# Patient Record
Sex: Female | Born: 1950 | Race: White | Hispanic: No | Marital: Single | Smoking: Never smoker
Health system: Southern US, Community
[De-identification: ages and names within clinical notes are randomized; demographics above are authoritative.]

## PROBLEM LIST (undated history)

## (undated) DIAGNOSIS — K219 Gastro-esophageal reflux disease without esophagitis: Secondary | ICD-10-CM

## (undated) DIAGNOSIS — M1990 Unspecified osteoarthritis, unspecified site: Secondary | ICD-10-CM

## (undated) DIAGNOSIS — F419 Anxiety disorder, unspecified: Secondary | ICD-10-CM

## (undated) DIAGNOSIS — F32A Depression, unspecified: Secondary | ICD-10-CM

## (undated) DIAGNOSIS — T8859XA Other complications of anesthesia, initial encounter: Secondary | ICD-10-CM

## (undated) DIAGNOSIS — F329 Major depressive disorder, single episode, unspecified: Secondary | ICD-10-CM

## (undated) DIAGNOSIS — G4730 Sleep apnea, unspecified: Secondary | ICD-10-CM

## (undated) DIAGNOSIS — D649 Anemia, unspecified: Secondary | ICD-10-CM

## (undated) DIAGNOSIS — R112 Nausea with vomiting, unspecified: Secondary | ICD-10-CM

## (undated) DIAGNOSIS — R519 Headache, unspecified: Secondary | ICD-10-CM

## (undated) HISTORY — PX: APPENDECTOMY: SHX54

## (undated) HISTORY — PX: TONSILLECTOMY: SUR1361

## (undated) HISTORY — PX: OTHER SURGICAL HISTORY: SHX169

---

## 1998-05-21 ENCOUNTER — Other Ambulatory Visit: Admission: RE | Admit: 1998-05-21 | Discharge: 1998-05-21 | Payer: Self-pay | Admitting: Obstetrics & Gynecology

## 1999-02-19 ENCOUNTER — Ambulatory Visit: Admission: RE | Admit: 1999-02-19 | Discharge: 1999-02-19 | Payer: Self-pay | Admitting: Internal Medicine

## 1999-07-10 ENCOUNTER — Other Ambulatory Visit: Admission: RE | Admit: 1999-07-10 | Discharge: 1999-07-10 | Payer: Self-pay | Admitting: Obstetrics & Gynecology

## 2000-07-28 ENCOUNTER — Other Ambulatory Visit: Admission: RE | Admit: 2000-07-28 | Discharge: 2000-07-28 | Payer: Self-pay | Admitting: Obstetrics & Gynecology

## 2001-08-05 ENCOUNTER — Other Ambulatory Visit: Admission: RE | Admit: 2001-08-05 | Discharge: 2001-08-05 | Payer: Self-pay | Admitting: Obstetrics & Gynecology

## 2002-10-27 ENCOUNTER — Other Ambulatory Visit: Admission: RE | Admit: 2002-10-27 | Discharge: 2002-10-27 | Payer: Self-pay | Admitting: Obstetrics & Gynecology

## 2003-11-22 ENCOUNTER — Other Ambulatory Visit: Admission: RE | Admit: 2003-11-22 | Discharge: 2003-11-22 | Payer: Self-pay | Admitting: Obstetrics & Gynecology

## 2005-01-14 ENCOUNTER — Other Ambulatory Visit: Admission: RE | Admit: 2005-01-14 | Discharge: 2005-01-14 | Payer: Self-pay | Admitting: Obstetrics & Gynecology

## 2005-08-11 ENCOUNTER — Ambulatory Visit: Payer: Self-pay | Admitting: Sports Medicine

## 2005-08-28 ENCOUNTER — Ambulatory Visit: Payer: Self-pay | Admitting: Family Medicine

## 2005-12-08 ENCOUNTER — Ambulatory Visit: Payer: Self-pay | Admitting: Family Medicine

## 2005-12-22 ENCOUNTER — Ambulatory Visit: Payer: Self-pay | Admitting: Family Medicine

## 2006-06-08 ENCOUNTER — Emergency Department (HOSPITAL_COMMUNITY): Admission: EM | Admit: 2006-06-08 | Discharge: 2006-06-09 | Payer: Self-pay | Admitting: Emergency Medicine

## 2014-07-09 ENCOUNTER — Other Ambulatory Visit: Payer: Self-pay | Admitting: Obstetrics & Gynecology

## 2014-07-09 DIAGNOSIS — Z780 Asymptomatic menopausal state: Secondary | ICD-10-CM

## 2014-07-16 ENCOUNTER — Ambulatory Visit
Admission: RE | Admit: 2014-07-16 | Discharge: 2014-07-16 | Disposition: A | Discharge status: Home / Self Care | Payer: BC Managed Care – PPO | Source: Ambulatory Visit | Attending: Obstetrics & Gynecology | Admitting: Obstetrics & Gynecology

## 2014-07-16 DIAGNOSIS — Z780 Asymptomatic menopausal state: Secondary | ICD-10-CM

## 2014-07-24 ENCOUNTER — Other Ambulatory Visit: Payer: Self-pay | Admitting: Obstetrics & Gynecology

## 2014-07-25 LAB — CYTOLOGY - PAP

## 2014-07-27 ENCOUNTER — Encounter: Payer: Self-pay | Admitting: Internal Medicine

## 2018-10-26 ENCOUNTER — Other Ambulatory Visit: Payer: Self-pay

## 2018-10-26 ENCOUNTER — Emergency Department (HOSPITAL_COMMUNITY): Payer: Medicare Other

## 2018-10-26 ENCOUNTER — Emergency Department (HOSPITAL_COMMUNITY)
Admission: EM | Admit: 2018-10-26 | Discharge: 2018-10-26 | Disposition: A | Discharge status: Home / Self Care | Payer: Medicare Other | Attending: Emergency Medicine | Admitting: Emergency Medicine

## 2018-10-26 ENCOUNTER — Encounter (HOSPITAL_COMMUNITY): Payer: Self-pay | Admitting: Emergency Medicine

## 2018-10-26 DIAGNOSIS — S0990XA Unspecified injury of head, initial encounter: Secondary | ICD-10-CM | POA: Diagnosis present

## 2018-10-26 DIAGNOSIS — W010XXA Fall on same level from slipping, tripping and stumbling without subsequent striking against object, initial encounter: Secondary | ICD-10-CM | POA: Insufficient documentation

## 2018-10-26 DIAGNOSIS — Y9301 Activity, walking, marching and hiking: Secondary | ICD-10-CM | POA: Insufficient documentation

## 2018-10-26 DIAGNOSIS — Y92481 Parking lot as the place of occurrence of the external cause: Secondary | ICD-10-CM | POA: Insufficient documentation

## 2018-10-26 DIAGNOSIS — Y999 Unspecified external cause status: Secondary | ICD-10-CM | POA: Insufficient documentation

## 2018-10-26 DIAGNOSIS — S0081XA Abrasion of other part of head, initial encounter: Secondary | ICD-10-CM | POA: Insufficient documentation

## 2018-10-26 DIAGNOSIS — W19XXXA Unspecified fall, initial encounter: Secondary | ICD-10-CM

## 2018-10-26 HISTORY — DX: Major depressive disorder, single episode, unspecified: F32.9

## 2018-10-26 HISTORY — DX: Depression, unspecified: F32.A

## 2018-10-26 MED ORDER — METHOCARBAMOL 500 MG PO TABS
500.0000 mg | ORAL_TABLET | Freq: Once | ORAL | Status: DC
  Filled 2018-10-26: qty 1

## 2018-10-26 MED ORDER — ACETAMINOPHEN 325 MG PO TABS
650.0000 mg | ORAL_TABLET | Freq: Once | ORAL | Status: AC
  Administered 2018-10-26: 650 mg via ORAL
  Filled 2018-10-26: qty 2

## 2018-10-26 MED ORDER — METHOCARBAMOL 500 MG PO TABS: 500.0000 mg | ORAL_TABLET | Freq: Two times a day (BID) | ORAL | 0 refills | Status: AC

## 2018-10-26 NOTE — Discharge Instructions (Signed)
I have prescribed muscle relaxers for your pain, please do not drink or drive while taking this medications as they can make you drowsy. Please follow-up with PCP as needed.
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
## 2018-10-26 NOTE — ED Notes (Signed)
Pt refusing to have wound cleaned at this time.

## 2018-10-26 NOTE — ED Provider Notes (Signed)
  Face-to-face evaluation   History: Here for mechanical fall and tripped.  Injuries only to face.  Denies neck or back pain.  Physical exam:, Calm, cooperative.  She is lucid.  Face with mild left periorbital tenderness, swelling and ecchymosis.  Minor facial abrasions.  She exhibits normal range of motion of her neck.  Medical screening examination/treatment/procedure(s) were conducted as a shared visit with non-physician practitioner(s) and myself.  I personally evaluated the patient during the encounter    Mancel BaleWentz, Keagan Anthis, MD 10/26/18 2111

## 2018-10-26 NOTE — ED Triage Notes (Signed)
Per EMS, patient from the store, c/o right leg pain after trip and fall outside. Lac above left eye and hematoma to left cheek. Denies taking blood thinners, neck and back pain and LOC.

## 2019-05-15 ENCOUNTER — Encounter: Payer: Self-pay | Admitting: Family Medicine

## 2019-05-15 ENCOUNTER — Other Ambulatory Visit: Payer: Self-pay

## 2019-05-15 ENCOUNTER — Ambulatory Visit (INDEPENDENT_AMBULATORY_CARE_PROVIDER_SITE_OTHER): Payer: Medicare Other | Admitting: Family Medicine

## 2019-05-15 DIAGNOSIS — M542 Cervicalgia: Secondary | ICD-10-CM

## 2019-05-15 MED ORDER — TIZANIDINE HCL 2 MG PO TABS: 2.0000 mg | ORAL_TABLET | Freq: Four times a day (QID) | ORAL | 1 refills | Status: DC | PRN

## 2019-05-15 MED ORDER — NABUMETONE 750 MG PO TABS: 750.0000 mg | ORAL_TABLET | Freq: Two times a day (BID) | ORAL | 6 refills | Status: DC | PRN

## 2019-05-15 NOTE — Progress Notes (Signed)
Office Visit Note   Patient: Cheryl Schmitt           Date of Birth: October 30, 1951           MRN: [id number] Visit Date: 05/15/2019 Requested by: No referring provider defined for this encounter. PCP: Cheryl Schmitt  Subjective: Chief Complaint  Patient presents with  . Neck - Pain    Fell on 04/17/2019, halfway down the steps of her house - hit her head. Pain in the neck and right scapula, with tingling down the right arm.    HPI: She is here with neck and right arm pain.  On June 1 she fell down steps of her house, she fell forward and hit her head.  Did not lose consciousness but had pain and bruising on her head and her left shoulder.  Her left shoulder never hurt and she still has a residual bruising there but ever since the fall she has had stiffness and pain in her neck, severe pain in her shoulder blade on the right and tingling down her right arm.  She has not noticed any weakness in her arm.  She went to [facility] and had x-rays taken on June 6.  I reviewed these as well and I do not see a fracture but she has diffuse spondylosis changes in her spine.  She states that she has never had much trouble with her back before.  Her shoulder blade pain keeps her from sleeping.  She has tried Advil and Aleve and Tylenol with minimal improvement.  She was given a muscle relaxant at [facility] and that has not helped much either.               ROS: Denies fevers or chills.  All other systems were reviewed and are negative.  Objective: Vital Signs: There were no vitals taken for this visit.  Physical Exam:  General:  Alert and oriented, in no acute distress. Pulm:  Breathing unlabored. Psy:  Normal mood, congruent affect. Skin: No rash on her skin.  She has resolving ecchymosis left deltoid area. Neck: Tightness and tenderness of the paraspinous muscles diffusely.  Spurling's test is equivocal, slightly decreased range of motion with neck extension and rotation bilaterally.  Very  tender rhomboid area trigger point on the right seems to reproduce much of her pain.  Upper extremity strength and reflexes are still normal.  Imaging: None today.  Assessment & Plan: 1.  Persistent neck and right arm pain 1 month status post fall, concerning for cervical disc protrusion with nerve impingement.  Neurologic exam is nonfocal. -Trial of physical therapy for myofascial release techniques and cervical traction.  We will go ahead and order an MRI scan and if she fails to improve with therapy, then possibly cervical epidural injection depending on MRI results. -Trial of Relafen and Zanaflex as needed. - If patient is not quickly improving with physical therapy, she will contact me for chiropractic referral.     Procedures: No procedures performed  No notes on file     PMFS History: There are no active problems to display for this patient.  Past Medical History:  Diagnosis Date  . Depression     History reviewed. No pertinent family history.  History reviewed. No pertinent surgical history. Social History   Occupational History  . Not on file  Tobacco Use  . Smoking status: Not on file  Substance and Sexual Activity  . Alcohol use: Not on file  . Drug  use: Not on file  . Sexual activity: Not on file

## 2019-05-16 ENCOUNTER — Other Ambulatory Visit: Payer: Self-pay

## 2019-05-16 ENCOUNTER — Ambulatory Visit: Payer: Medicare Other | Attending: Family Medicine | Admitting: Physical Therapy

## 2019-05-16 ENCOUNTER — Encounter: Payer: Self-pay | Admitting: Physical Therapy

## 2019-05-16 ENCOUNTER — Telehealth: Payer: Self-pay | Admitting: Family Medicine

## 2019-05-16 DIAGNOSIS — M6281 Muscle weakness (generalized): Secondary | ICD-10-CM | POA: Diagnosis present

## 2019-05-16 DIAGNOSIS — M542 Cervicalgia: Secondary | ICD-10-CM

## 2019-05-16 DIAGNOSIS — R293 Abnormal posture: Secondary | ICD-10-CM | POA: Diagnosis present

## 2019-05-16 NOTE — Telephone Encounter (Signed)
Patient called stated she lost the slip that had 2 medications that should be filled and the name of a PT the patient is to be going to.  [phone]

## 2019-05-16 NOTE — Patient Instructions (Signed)

## 2019-05-16 NOTE — Therapy (Signed)
[facility] [address], [place], [place], [address] Phone: [phone]   Fax:  [phone]  Physical Therapy Evaluation  Patient Details  Name: Cheryl Schmitt MRN: [id number] Date of Birth: 12/29/1950 Referring Provider (PT): Eunice Blase, MD   Encounter Date: 05/16/2019  PT End of Session - 05/16/19 1902    Visit Number  1    Date for PT Re-Evaluation  07/11/19    PT Start Time  1902    PT Stop Time  1940    PT Time Calculation (min)  38 min    Activity Tolerance  Patient tolerated treatment well    Behavior During Therapy  [facility] for tasks assessed/performed       Past Medical History:  Diagnosis Date  . Depression     History reviewed. No pertinent surgical history.  There were no vitals filed for this visit.   Subjective Assessment - 05/16/19 1905    Subjective  Pt fell and hit her head and has been having neck pain and around the shoulder.  She reports she cannot sleep very long.  I have had to sit up and sleep in the recliner.    Currently in Pain?  Yes    Pain Score  2     Pain Location  Neck    Pain Orientation  Right    Pain Descriptors / Indicators  Tingling;Sharp;Aching    Pain Type  Acute pain    Pain Radiating Towards  tingles down the arm to finger tips, pain around Rt scapula    Pain Onset  More than a month ago    Pain Frequency  Intermittent    Aggravating Factors   lie back on it wrong, doing thing with the Rt arm    Pain Relieving Factors  Tylenol, heat, sitting up straight    Effect of Pain on Daily Activities  sleep and doing things with the Rt arm    Multiple Pain Sites  No         [facility] PT Assessment - 05/16/19 0001      Assessment   Medical Diagnosis  M54.2 (ICD-10-CM) - Cervicalgia    Referring Provider (PT)  Hilts, Michael, MD    Onset Date/Surgical Date  04/17/19    Hand Dominance  Right    Prior Therapy  No      Precautions   Precautions  None      Restrictions    Weight Bearing Restrictions  No      Balance Screen   Has the patient fallen in the past 6 months  Yes    How many times?  2 x tripped in a parking lot and down the stairs    Has the patient had a decrease in activity level because of a fear of falling?   No    Is the patient reluctant to leave their home because of a fear of falling?   No      Home Film/video editor residence    Living Arrangements  Alone      Prior Function   Level of Independence  Independent    Vocation  Other (comment)   Not currently   Vocation Requirements  working at [employer], boxes, books - librarian      Cognition   Overall Cognitive Status  Within Functional Limits for tasks assessed      Observation/Other Assessments   Focus on Therapeutic Outcomes (FOTO)  51%      Posture/Postural Control   Posture/Postural Control  Postural limitations    Postural Limitations  Rounded Shoulders;Forward head    Posture Comments  leaning left in sitting, Rt shoulder elevated      ROM / Strength   AROM / PROM / Strength  AROM;Strength      AROM   AROM Assessment Site  Cervical    Cervical Flexion  25    Cervical Extension  37    Cervical - Right Side Bend  11    Cervical - Left Side Bend  18    Cervical - Right Rotation  35    Cervical - Left Rotation  40   weaker     Strength   Overall Strength Comments  elbow extension Rt 4-/5    Strength Assessment Site  Shoulder    Right/Left Shoulder  Right;Left    Right Shoulder Flexion  4-/5    Right Shoulder ABduction  4/5    Left Shoulder ABduction  4-/5      Palpation   Palpation comment  TTP and tight suboccipitals, cervical paraspinals, upper traps - Rt >Lt      Special Tests    Special Tests  Cervical    Cervical Tests  Spurling's;Dictraction      Spurling's   Findings  Negative      Distraction Test   Findngs  Positive    Comment  felt relief when did in seated position, increased pain around shoulder in supine                 Objective measurements completed on examination: See above findings.      [facility] Adult PT Treatment/Exercise - 05/16/19 0001      Self-Care   Self-Care  Other Self-Care Comments    Other Self-Care Comments   initial HEP educated and performed             PT Education - 05/16/19 1953    Education Details  Access Code: [email or web address] and dry needling info    Person(s) Educated  Patient    Methods  Explanation;Demonstration;Handout;Verbal cues    Comprehension  Returned demonstration;Verbalized understanding       PT Short Term Goals - 05/16/19 1957      PT SHORT TERM GOAL #1   Title  ind with initial HEP    Time  4    Period  Weeks    Status  New    Target Date  06/13/19      PT SHORT TERM GOAL #2   Title  cervical ROM improved to at least 45 deg bilat    Time  4    Period  Weeks    Status  New    Target Date  06/13/19      PT SHORT TERM GOAL #3   Title  Pt will have no radicular symptoms down her arm due to reduced muscle spasms    Time  4    Period  Weeks    Status  New    Target Date  07/11/19        PT Long Term Goals - 05/16/19 1953      PT LONG TERM GOAL #1   Title  Pt will be able to take a bath due to increased UE strength    Baseline  cannot push up out of the tub    Time  8    Period  Weeks    Status  New    Target Date  07/11/19      PT LONG TERM GOAL #2   Title  Pt will demonstrate at least 50 deg of cervical rotation bilat for improved ability to look behind when driving    Time  8    Period  Weeks    Status  New    Target Date  07/11/19      PT LONG TERM GOAL #3   Title  Pt will report at least 60% less pain when cleaning her home    Time  8    Period  Weeks    Status  New    Target Date  07/11/19      PT LONG TERM GOAL #4   Title  Pt will report < or = 40% limitation on FOTO    Time  8    Period  Weeks    Status  New    Target Date  07/11/19             Plan - 05/16/19 1958    Clinical Impression  Statement  Pt presents to skilled PT due to falling one month ago and hit her head. She is having pain and radicular symptoms around the Rt shoulder blade with tingling down the Rt arm. Pt has decreased UE strength and decreased cervical ROM and strength.  Pt's Rt shoulder is elevated with increased tone in Rt upper trap.  She has some wingin of Rt scapula with shoulder flexion.  Pt has increased thoracic kyphosis.  She has muscle spasms throughout her suboccipitals, cervical paraspinals and upper traps all worse on the Rt side.  Pt will benefit from skilled PT to address impairments and facilitate healing of soft tissues so she can return to maximum function.    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    Examination-Activity Limitations  Sleep;Bathing    Examination-Participation Restrictions  Driving    Stability/Clinical Decision Making  Evolving/Moderate complexity    Clinical Decision Making  Moderate    Rehab Potential  Good    PT Frequency  2x / week    PT Duration  8 weeks    PT Treatment/Interventions  ADLs/Self Care Home Management;Cryotherapy;Electrical Stimulation;Moist Heat;Traction;Therapeutic activities;Therapeutic exercise;Neuromuscular re-education;Manual techniques;Dry needling;Passive range of motion;Patient/family education;Taping    PT Next Visit Plan  dry needling to suboccipitals, oblique capitus, cervical parapsinals, traction, cervical ROM, posture    Consulted and Agree with Plan of Care  Patient       Patient will benefit from skilled therapeutic intervention in order to improve the following deficits and impairments:  Pain, Increased fascial restricitons, Increased muscle spasms, Impaired tone, Postural dysfunction, Decreased strength, Decreased range of motion  Visit Diagnosis: 1. Cervicalgia   2. Abnormal posture   3. Muscle weakness (generalized)        Problem List There are no active problems to  display for this patient.   Junious SilkJakki L Hisayo Delossantos, PT 05/16/2019, 8:11 PM  [facility] [address], [address] [place], [place], [address] Phone: [phone]   Fax:  [phone]  Name: Farrel GobbleMartha Panas MRN: [id number] Date of Birth: [birth year]

## 2019-05-17 NOTE — Telephone Encounter (Signed)
I called the patient yesterday afternoon. She was driving, so she could not write down the information I gave her. She requested a new AVS to be printed for her to pick up (this info is included on it). The front desk to care of it from there.

## 2019-05-18 ENCOUNTER — Ambulatory Visit: Payer: Medicare Other | Attending: Family Medicine | Admitting: Physical Therapy

## 2019-05-18 ENCOUNTER — Encounter: Payer: Self-pay | Admitting: Physical Therapy

## 2019-05-18 ENCOUNTER — Other Ambulatory Visit: Payer: Self-pay

## 2019-05-18 DIAGNOSIS — R293 Abnormal posture: Secondary | ICD-10-CM | POA: Diagnosis present

## 2019-05-18 DIAGNOSIS — M542 Cervicalgia: Secondary | ICD-10-CM

## 2019-05-18 DIAGNOSIS — M6281 Muscle weakness (generalized): Secondary | ICD-10-CM

## 2019-05-18 NOTE — Therapy (Signed)
[facility] [address], [place], [place], [address] Phone: [phone]   Fax:  [phone]  Physical Therapy Treatment  Patient Details  Name: Cheryl Schmitt MRN: [id number] Date of Birth: 1951/07/20 Referring Provider (PT): Eunice Blase, MD   Encounter Date: 05/18/2019  PT End of Session - 05/18/19 1537    Visit Number  2    Date for PT Re-Evaluation  07/11/19    PT Start Time  1505    PT Stop Time  1550    PT Time Calculation (min)  45 min    Activity Tolerance  Patient tolerated treatment well    Behavior During Therapy  [facility] for tasks assessed/performed       Past Medical History:  Diagnosis Date  . Depression     History reviewed. No pertinent surgical history.  There were no vitals filed for this visit.  Subjective Assessment - 05/18/19 1544    Subjective  I am a little more stiff than yesterday    Currently in Pain?  Yes    Pain Score  4     Pain Location  Neck    Pain Orientation  Right    Pain Descriptors / Indicators  Sharp    Pain Type  Acute pain    Pain Onset  More than a month ago    Pain Frequency  Intermittent    Multiple Pain Sites  No                       [facility] Adult PT Treatment/Exercise - 05/18/19 0001      Modalities   Modalities  Moist Heat;Electrical Stimulation      Moist Heat Therapy   Number Minutes Moist Heat  15 Minutes    Moist Heat Location  Cervical      Electrical Stimulation   Electrical Stimulation Location  Rt scapula    Electrical Stimulation Action  IFC    Electrical Stimulation Parameters  to tolerance - 62m    Electrical Stimulation Goals  Pain      Manual Therapy   Manual Therapy  Soft tissue mobilization    Soft tissue mobilization  periscapular muslce, teres minor and infraspinatus, upper trap, cervical paraspinlas, suboccipitals       Trigger Point Dry Needling - 05/18/19 0001    Consent Given?  Yes    Education Handout  Provided  Previously provided    Muscles Treated Head and Neck  Suboccipitals;Upper trapezius    Upper Trapezius Response  Twitch reponse elicited;Palpable increased muscle length             PT Short Term Goals - 05/16/19 1957      PT SHORT TERM GOAL #1   Title  ind with initial HEP    Time  4    Period  Weeks    Status  New    Target Date  06/13/19      PT SHORT TERM GOAL #2   Title  cervical ROM improved to at least 45 deg bilat    Time  4    Period  Weeks    Status  New    Target Date  06/13/19      PT SHORT TERM GOAL #3   Title  Pt will have no radicular symptoms down her arm due to reduced muscle spasms    Time  4    Period  Weeks    Status  New  Target Date  07/11/19        PT Long Term Goals - 05/16/19 1953      PT LONG TERM GOAL #1   Title  Pt will be able to take a bath due to increased UE strength    Baseline  cannot push up out of the tub    Time  8    Period  Weeks    Status  New    Target Date  07/11/19      PT LONG TERM GOAL #2   Title  Pt will demonstrate at least 50 deg of cervical rotation bilat for improved ability to look behind when driving    Time  8    Period  Weeks    Status  New    Target Date  07/11/19      PT LONG TERM GOAL #3   Title  Pt will report at least 60% less pain when cleaning her home    Time  8    Period  Weeks    Status  New    Target Date  07/11/19      PT LONG TERM GOAL #4   Title  Pt will report < or = 40% limitation on FOTO    Time  8    Period  Weeks    Status  New    Target Date  07/11/19            Plan - 05/18/19 1539    Clinical Impression Statement  No goals met yet today, first treatment.  Pt did well with dry needling although she was nervous about it initially.  Pt had a lot of muscle twitches on Rt upper trap.  She had good release with manual techniques.  pt will continue to benefit from skilled PT to help reduce muscle spasms    PT Treatment/Interventions  ADLs/Self Care Home  Management;Cryotherapy;Electrical Stimulation;Moist Heat;Traction;Therapeutic activities;Therapeutic exercise;Neuromuscular re-education;Manual techniques;Dry needling;Passive range of motion;Patient/family education;Taping    PT Next Visit Plan  f/u on dry needling to suboccipitals, upper trap, cervical ROM, posture    PT Home Exercise Plan  Access Code: [email or web address]    Consulted and Agree with Plan of Care  Patient       Patient will benefit from skilled therapeutic intervention in order to improve the following deficits and impairments:  Pain, Increased fascial restricitons, Increased muscle spasms, Impaired tone, Postural dysfunction, Decreased strength, Decreased range of motion  Visit Diagnosis: 1. Cervicalgia   2. Abnormal posture   3. Muscle weakness (generalized)        Problem List There are no active problems to display for this patient.   Jule Ser, PT 05/18/2019, 3:48 PM  [facility] [address] [place], [place], [address] Phone: [phone]   Fax:  [phone]  Name: Cheryl Schmitt MRN: [id number] Date of Birth: Dec 16, 1950

## 2019-05-22 ENCOUNTER — Encounter: Payer: Self-pay | Admitting: Physical Therapy

## 2019-05-22 ENCOUNTER — Other Ambulatory Visit: Payer: Self-pay

## 2019-05-22 ENCOUNTER — Ambulatory Visit: Payer: Medicare Other | Admitting: Physical Therapy

## 2019-05-22 DIAGNOSIS — R293 Abnormal posture: Secondary | ICD-10-CM

## 2019-05-22 DIAGNOSIS — M542 Cervicalgia: Secondary | ICD-10-CM

## 2019-05-22 DIAGNOSIS — M6281 Muscle weakness (generalized): Secondary | ICD-10-CM

## 2019-05-22 NOTE — Therapy (Signed)
[facility] [address], [address] [place], [place], [address] Phone: [phone]   Fax:  [phone]  Physical Therapy Treatment  Patient Details  Name: Cheryl GobbleMartha Peeler MRN: [id number] Date of Birth: 1951/04/11 Referring Provider (PT): Lavada MesiHilts, Michael, MD   Encounter Date: 05/22/2019  PT End of Session - 05/22/19 1312    Visit Number  3    Date for PT Re-Evaluation  07/11/19    PT Start Time  1306   came late   PT Stop Time  1350    PT Time Calculation (min)  44 min    Activity Tolerance  Patient tolerated treatment well    Behavior During Therapy  [facility] for tasks assessed/performed       Past Medical History:  Diagnosis Date  . Depression     History reviewed. No pertinent surgical history.  There were no vitals filed for this visit.  Subjective Assessment - 05/22/19 1310    Subjective  My neck is stiff but my shoulder blade on the right.    Patient Stated Goals  Take away the pain    Currently in Pain?  Yes    Pain Score  5     Pain Location  Scapula    Pain Orientation  Right    Pain Descriptors / Indicators  Throbbing    Pain Type  Acute pain    Pain Onset  More than a month ago    Pain Frequency  Intermittent    Aggravating Factors   lie on back on it wrond, doing thing with the right arm    Pain Relieving Factors  Tylenol, heat, sitting up straight.                       [facility] Adult PT Treatment/Exercise - 05/22/19 0001      Neck Exercises: Seated   Cervical Isometrics  Extension;3 secs;5 reps   to improve retraction   Neck Retraction  5 reps   no change in pain   Lateral Flexion  Right;5 reps      Neck Exercises: Supine   Cervical Isometrics  Extension;3 secs;5 reps   to improve retraction   Cervical Rotation  Right;Left;5 reps   A/AROM   Cervical Rotation Limitations  both ways    Lateral Flexion  Right;Left;5 reps   supine; A/AROM     Manual Therapy   Manual Therapy  Joint  mobilization;Soft tissue mobilization;Manual Traction    Joint Mobilization  T1-T5 P-A mobilization grade 3 in sitting; supine sideglide to bil. C2-C7, rotational mobilization to C1    Soft tissue mobilization  cervical paraspinals, subocciptials, SCM, along T1-T4, upper trap and scalenes    Manual Traction  1 min       Trigger Point Dry Needling - 05/22/19 0001    Consent Given?  Yes    Muscles Treated Head and Neck  Cervical multifidi    Other Dry Needling  T1-T4 multfidi    Cervical multifidi Response  Twitch reponse elicited;Palpable increased muscle length             PT Short Term Goals - 05/22/19 1351      PT SHORT TERM GOAL #1   Title  ind with initial HEP    Baseline  still learning    Time  4    Period  Weeks    Status  On-going    Target Date  06/13/19      PT  SHORT TERM GOAL #2   Title  cervical ROM improved to at least 45 deg bilat    Baseline  more after joint mobilization    Time  4    Period  Weeks    Status  On-going      PT SHORT TERM GOAL #3   Title  Pt will have no radicular symptoms down her arm due to reduced muscle spasms    Time  4    Period  Weeks    Status  On-going    Target Date  07/11/19        PT Long Term Goals - 05/16/19 1953      PT LONG TERM GOAL #1   Title  Pt will be able to take a bath due to increased UE strength    Baseline  cannot push up out of the tub    Time  8    Period  Weeks    Status  New    Target Date  07/11/19      PT LONG TERM GOAL #2   Title  Pt will demonstrate at least 50 deg of cervical rotation bilat for improved ability to look behind when driving    Time  8    Period  Weeks    Status  New    Target Date  07/11/19      PT LONG TERM GOAL #3   Title  Pt will report at least 60% less pain when cleaning her home    Time  8    Period  Weeks    Status  New    Target Date  07/11/19      PT LONG TERM GOAL #4   Title  Pt will report < or = 40% limitation on FOTO    Time  8    Period  Weeks     Status  New    Target Date  07/11/19            Plan - 05/22/19 1348    Clinical Impression Statement  Patient had no  right scapular pain after treatment just tightness. Patient has decreased mobility of C1 to restrict rotation. Patient T1-T4 is in flexion. Patient has more tightness in the left cervical paraspinals. Patient is to continue to benefit from skilled PT to reduce muscle spasms and pain to improve funtion.    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    Examination-Activity Limitations  Sleep;Bathing    Examination-Participation Restrictions  Driving    Stability/Clinical Decision Making  Evolving/Moderate complexity    PT Frequency  2x / week    PT Duration  8 weeks    PT Treatment/Interventions  ADLs/Self Care Home Management;Cryotherapy;Electrical Stimulation;Moist Heat;Traction;Therapeutic activities;Therapeutic exercise;Neuromuscular re-education;Manual techniques;Dry needling;Passive range of motion;Patient/family education;Taping    PT Next Visit Plan  cervical ROM, posture, joint mobilization to cervical and upper thoracic, supine chin retraction, supine chin retraction with cervical sidebending    PT Home Exercise Plan  Access Code: [email or web address]    Recommended Other Services  MD signed initial note    Consulted and Agree with Plan of Care  Patient       Patient will benefit from skilled therapeutic intervention in order to improve the following deficits and impairments:  Pain, Increased fascial restricitons, Increased muscle spasms, Impaired tone, Postural dysfunction, Decreased strength, Decreased range of motion  Visit Diagnosis: 1. Cervicalgia   2. Abnormal posture  3. Muscle weakness (generalized)        Problem List There are no active problems to display for this patient.   Eulis FosterCheryl Veleta Yamamoto, PT 05/22/19 1:53 PM   [facility] [address],  [address] [place], [place], [address] Phone: [phone]   Fax:  [phone]  Name: Cheryl GobbleMartha Todaro MRN: [id number] Date of Birth: [birth year]

## 2019-05-24 ENCOUNTER — Encounter: Payer: Self-pay | Admitting: Physical Therapy

## 2019-05-24 ENCOUNTER — Ambulatory Visit: Payer: Medicare Other | Admitting: Physical Therapy

## 2019-05-24 ENCOUNTER — Other Ambulatory Visit: Payer: Self-pay

## 2019-05-24 DIAGNOSIS — R293 Abnormal posture: Secondary | ICD-10-CM

## 2019-05-24 DIAGNOSIS — M542 Cervicalgia: Secondary | ICD-10-CM | POA: Diagnosis not present

## 2019-05-24 DIAGNOSIS — M6281 Muscle weakness (generalized): Secondary | ICD-10-CM

## 2019-05-24 NOTE — Therapy (Signed)
[facility] [address] [place], [place], [address] Phone: [phone]   Fax:  [phone]  Physical Therapy Treatment  Patient Details  Name: Cheryl Schmitt MRN: [id number] Date of Birth: Apr 05, 1951 Referring Provider (PT): Eunice Blase, MD   Encounter Date: 05/24/2019  PT End of Session - 05/24/19 1916    Visit Number  4    Date for PT Re-Evaluation  07/11/19    PT Start Time  1900    PT Stop Time  1940    PT Time Calculation (min)  40 min    Activity Tolerance  Patient tolerated treatment well    Behavior During Therapy  [facility] for tasks assessed/performed       Past Medical History:  Diagnosis Date  . Depression     History reviewed. No pertinent surgical history.  There were no vitals filed for this visit.  Subjective Assessment - 05/24/19 1908    Subjective  It feels better than when I came on Monday.  It is a little tight.    Currently in Pain?  Yes    Pain Score  3     Pain Location  Neck    Pain Orientation  Right    Pain Descriptors / Indicators  Throbbing    Pain Onset  More than a month ago    Multiple Pain Sites  No                       [facility] Adult PT Treatment/Exercise - 05/24/19 0001      Neuro Re-ed    Neuro Re-ed Details   cues for posture and cervical retraction throughout      Neck Exercises: Supine   Neck Retraction  10 reps;3 secs    Shoulder ABduction  Both;15 reps;Limitations    Shoulder Abduction Limitations  horizontal abduction red band    Other Supine Exercise  shoulder ERwith cervical retraction - red band - 15 x      Shoulder Exercises: Standing   Extension  Strengthening;Both;15 reps;Theraband    Theraband Level (Shoulder Extension)  Level 1 (Yellow)    Row  Strengthening;15 reps;Theraband    Theraband Level (Shoulder Row)  Level 1 (Yellow)    Other Standing Exercises  tricep extension - yellow band - 15x       Manual Therapy   Soft tissue  mobilization  cervical paraspinals, subocciptials, SCM, along T1-T4, upper trap and scalenes    Manual Traction  1 min               PT Short Term Goals - 05/24/19 1914      PT SHORT TERM GOAL #1   Title  ind with initial HEP    Status  Achieved      PT SHORT TERM GOAL #3   Title  Pt will have no radicular symptoms down her arm due to reduced muscle spasms    Baseline  feel the tingling when I grip things in my fingers    Status  On-going        PT Long Term Goals - 05/16/19 1953      PT LONG TERM GOAL #1   Title  Pt will be able to take a bath due to increased UE strength    Baseline  cannot push up out of the tub    Time  8    Period  Weeks    Status  New  Target Date  07/11/19      PT LONG TERM GOAL #2   Title  Pt will demonstrate at least 50 deg of cervical rotation bilat for improved ability to look behind when driving    Time  8    Period  Weeks    Status  New    Target Date  07/11/19      PT LONG TERM GOAL #3   Title  Pt will report at least 60% less pain when cleaning her home    Time  8    Period  Weeks    Status  New    Target Date  07/11/19      PT LONG TERM GOAL #4   Title  Pt will report < or = 40% limitation on FOTO    Time  8    Period  Weeks    Status  New    Target Date  07/11/19            Plan - 05/24/19 1947    Clinical Impression Statement  Pt had no increased pain with exercises.  She did report some tingling in right hand when doing exercises supine.  Pt is feeling less pain since start of PT . Pt needs cues for posture throughout treatment.  She will continue to benefit from skilled PT to address muscle and soft tissue adhesions for pain management.    PT Treatment/Interventions  ADLs/Self Care Home Management;Cryotherapy;Electrical Stimulation;Moist Heat;Traction;Therapeutic activities;Therapeutic exercise;Neuromuscular re-education;Manual techniques;Dry needling;Passive range of motion;Patient/family education;Taping     PT Next Visit Plan  f/u on band exercises added to HEP, cervical ROM, posture, joint mobilization to cervical and upper thoracic, supine chin retraction, supine chin retraction with cervical sidebending    PT Home Exercise Plan  Access Code: [email or web address]    Consulted and Agree with Plan of Care  Patient       Patient will benefit from skilled therapeutic intervention in order to improve the following deficits and impairments:  Pain, Increased fascial restricitons, Increased muscle spasms, Impaired tone, Postural dysfunction, Decreased strength, Decreased range of motion  Visit Diagnosis: 1. Cervicalgia   2. Abnormal posture   3. Muscle weakness (generalized)        Problem List There are no active problems to display for this patient.   Cheryl Schmitt, PT 05/24/2019, 7:50 PM  [facility] [address], [address] [place], [place], [address] Phone: [phone]   Fax:  [phone]  Name: Cheryl GobbleMartha Schmitt MRN: [id number] Date of Birth: 27-Feb-1951

## 2019-05-30 ENCOUNTER — Other Ambulatory Visit: Payer: Self-pay

## 2019-05-30 ENCOUNTER — Ambulatory Visit: Payer: Medicare Other | Admitting: Physical Therapy

## 2019-05-30 ENCOUNTER — Encounter: Payer: Self-pay | Admitting: Physical Therapy

## 2019-05-30 DIAGNOSIS — M542 Cervicalgia: Secondary | ICD-10-CM

## 2019-05-30 DIAGNOSIS — R293 Abnormal posture: Secondary | ICD-10-CM

## 2019-05-30 DIAGNOSIS — M6281 Muscle weakness (generalized): Secondary | ICD-10-CM

## 2019-05-30 NOTE — Therapy (Signed)
[facility] [address], [address] [place], [place], [address] Phone: [phone]   Fax:  [phone]  Physical Therapy Treatment  Patient Details  Name: Cheryl Schmitt MRN: [id number] Date of Birth: Apr 27, 1951 Referring Provider (PT): Lavada MesiHilts, Michael, MD   Encounter Date: 05/30/2019  PT End of Session - 05/30/19 1049    Visit Number  5    Date for PT Re-Evaluation  07/11/19    Authorization Type  [employer] medicare    PT Start Time  1005    PT Stop Time  1045    PT Time Calculation (min)  40 min    Activity Tolerance  Patient tolerated treatment well;No increased pain    Behavior During Therapy  [facility] for tasks assessed/performed       Past Medical History:  Diagnosis Date  . Depression     History reviewed. No pertinent surgical history.  There were no vitals filed for this visit.  Subjective Assessment - 05/30/19 1008    Subjective  I still feel stiff. I still have trouble sleeping and when I wake up the right shoulder hurts and neck is stiff. I have been doing my exercises.    Patient Stated Goals  Take away the pain    Currently in Pain?  Yes    Pain Score  2     Pain Location  Neck    Pain Orientation  Right    Pain Descriptors / Indicators  Throbbing    Pain Type  Acute pain    Pain Onset  More than a month ago    Pain Frequency  Intermittent    Aggravating Factors   lie on back on it wrong, doing thing with the right arm    Pain Relieving Factors  Tylenol, heat, sitting up straight    Multiple Pain Sites  No                       [facility] Adult PT Treatment/Exercise - 05/30/19 0001      Self-Care   Self-Care  Other Self-Care Comments    Other Self-Care Comments   instructed patient on different sleeping positions to decrease strain on cervical      Neck Exercises: Supine   Neck Retraction  5 reps;5 secs    Lateral Flexion  Right;5 reps   using right hand to slide over     Manual Therapy   Manual Therapy  Joint mobilization;Soft tissue mobilization    Joint Mobilization  T1-T5 P-A mobilization grade 3 in sitting; supine sideglide to bil. C2-C7, rotational mobilization to C1    Soft tissue mobilization  cervical paraspinals, subocciptials, SCM, along T1-T4, upper trap and scalenes    Manual Traction  1 min       Trigger Point Dry Needling - 05/30/19 0001    Consent Given?  Yes    Muscles Treated Head and Neck  Cervical multifidi;Levator scapulae    Muscles Treated Upper Quadrant  Rhomboids   right   Other Dry Needling  T1-T4 multfidi    Levator Scapulae Response  Twitch response elicited;Palpable increased muscle length    Cervical multifidi Response  Twitch reponse elicited;Palpable increased muscle length    Rhomboids Response  Twitch response elicited;Palpable increased muscle length             PT Short Term Goals - 05/24/19 1914      PT SHORT TERM GOAL #1   Title  ind with initial  HEP    Status  Achieved      PT SHORT TERM GOAL #3   Title  Pt will have no radicular symptoms down her arm due to reduced muscle spasms    Baseline  feel the tingling when I grip things in my fingers    Status  On-going        PT Long Term Goals - 05/16/19 1953      PT LONG TERM GOAL #1   Title  Pt will be able to take a bath due to increased UE strength    Baseline  cannot push up out of the tub    Time  8    Period  Weeks    Status  New    Target Date  07/11/19      PT LONG TERM GOAL #2   Title  Pt will demonstrate at least 50 deg of cervical rotation bilat for improved ability to look behind when driving    Time  8    Period  Weeks    Status  New    Target Date  07/11/19      PT LONG TERM GOAL #3   Title  Pt will report at least 60% less pain when cleaning her home    Time  8    Period  Weeks    Status  New    Target Date  07/11/19      PT LONG TERM GOAL #4   Title  Pt will report < or = 40% limitation on FOTO    Time  8    Period  Weeks    Status   New    Target Date  07/11/19            Plan - 05/30/19 1050    Clinical Impression Statement  Patient right scapula pain would be reduced with cervical retraction then right sidebending in supine. Patient has learned ways to sleep with decreased strain on cervical. After treatment patient had no pain. Patient has tightness in C1-C3 and C7-T2. Patient will continue to benefit from skilled PT to address muscle and soft tissue adhesions for pain management.    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    Examination-Activity Limitations  Sleep;Bathing    Examination-Participation Restrictions  Driving    Stability/Clinical Decision Making  Evolving/Moderate complexity    Rehab Potential  Good    PT Frequency  2x / week    PT Duration  8 weeks    PT Treatment/Interventions  ADLs/Self Care Home Management;Cryotherapy;Electrical Stimulation;Moist Heat;Traction;Therapeutic activities;Therapeutic exercise;Neuromuscular re-education;Manual techniques;Dry needling;Passive range of motion;Patient/family education;Taping    PT Next Visit Plan  update HEP including chin retraction in supine 5 times followed by right cervical sidebending in supine; soft tissue work, joint mobilization, see if sleeping is better; update goals    PT Home Exercise Plan  Access Code: [email or web address]    Consulted and Agree with Plan of Care  Patient       Patient will benefit from skilled therapeutic intervention in order to improve the following deficits and impairments:  Pain, Increased fascial restricitons, Increased muscle spasms, Impaired tone, Postural dysfunction, Decreased strength, Decreased range of motion  Visit Diagnosis: 1. Cervicalgia   2. Abnormal posture   3. Muscle weakness (generalized)        Problem List There are no active problems to display for this patient.   Earlie Counts, PT 05/30/19 10:55  AM   [facility] [address], [address] [place], [place], [address] Phone: [phone]   Fax:  [phone]  Name: Cheryl Schmitt MRN: [id number] Date of Birth: [birth year]

## 2019-05-31 ENCOUNTER — Other Ambulatory Visit: Payer: Self-pay

## 2019-05-31 ENCOUNTER — Ambulatory Visit: Payer: Medicare Other | Admitting: Physical Therapy

## 2019-05-31 ENCOUNTER — Encounter: Payer: Self-pay | Admitting: Physical Therapy

## 2019-05-31 DIAGNOSIS — R293 Abnormal posture: Secondary | ICD-10-CM

## 2019-05-31 DIAGNOSIS — M542 Cervicalgia: Secondary | ICD-10-CM

## 2019-05-31 DIAGNOSIS — M6281 Muscle weakness (generalized): Secondary | ICD-10-CM

## 2019-05-31 NOTE — Therapy (Signed)
[facility] [address], [place], [place], [address] Phone: [phone]   Fax:  [phone]  Physical Therapy Treatment  Patient Details  Name: Cheryl Schmitt MRN: [id number] Date of Birth: 09-Jun-1951 Referring Provider (PT): Eunice Blase, MD   Encounter Date: 05/31/2019  PT End of Session - 05/31/19 1540    Visit Number  6    Date for PT Re-Evaluation  07/11/19    Authorization Type  [employer] medicare    PT Start Time  3329    PT Stop Time  1620    PT Time Calculation (min)  40 min    Activity Tolerance  Patient tolerated treatment well;No increased pain    Behavior During Therapy  WFL for tasks assessed/performed       Past Medical History:  Diagnosis Date  . Depression     History reviewed. No pertinent surgical history.  There were no vitals filed for this visit.  Subjective Assessment - 05/31/19 1547    Subjective  I feel better after yesterday.  Much less pain.  I carry more on my left side because I hurt my Rt bicep a while back.    Patient Stated Goals  Take away the pain    Currently in Pain?  Yes    Pain Score  2     Pain Location  Neck    Pain Orientation  Right    Pain Descriptors / Indicators  Sore    Pain Type  Acute pain    Pain Onset  More than a month ago    Multiple Pain Sites  No                       [facility] Adult PT Treatment/Exercise - 05/31/19 0001      Neck Exercises: Supine   Neck Retraction  5 reps;5 secs    Capital Flexion  10 reps;3 secs    Lateral Flexion  Right;5 reps   using right hand to slide over     Manual Therapy   Manual Therapy  Myofascial release    Soft tissue mobilization  periscapular muslce, teres minor and infraspinatus, upper trap, cervical paraspinlas, suboccipitals    Myofascial Release  suboccipital release               PT Short Term Goals - 05/24/19 1914      PT SHORT TERM GOAL #1   Title  ind with initial HEP    Status   Achieved      PT SHORT TERM GOAL #3   Title  Pt will have no radicular symptoms down her arm due to reduced muscle spasms    Baseline  feel the tingling when I grip things in my fingers    Status  On-going        PT Long Term Goals - 05/16/19 1953      PT LONG TERM GOAL #1   Title  Pt will be able to take a bath due to increased UE strength    Baseline  cannot push up out of the tub    Time  8    Period  Weeks    Status  New    Target Date  07/11/19      PT LONG TERM GOAL #2   Title  Pt will demonstrate at least 50 deg of cervical rotation bilat for improved ability to look behind when driving    Time  8  Period  Weeks    Status  New    Target Date  07/11/19      PT LONG TERM GOAL #3   Title  Pt will report at least 60% less pain when cleaning her home    Time  8    Period  Weeks    Status  New    Target Date  07/11/19      PT LONG TERM GOAL #4   Title  Pt will report < or = 40% limitation on FOTO    Time  8    Period  Weeks    Status  New    Target Date  07/11/19            Plan - 05/31/19 1650    Clinical Impression Statement  Pt did well with exercises added to HEP.  She felt sore for several days after adding the band exercise so that was removed from HEP.  Pt had good response from manual therapy.  There was improved mobility of upper cervial vertebrea palpated after myofascial release.  Pt will continue to benefit from skilled PT for strength and improved soft tissue length to improve function.    PT Treatment/Interventions  ADLs/Self Care Home Management;Cryotherapy;Electrical Stimulation;Moist Heat;Traction;Therapeutic activities;Therapeutic exercise;Neuromuscular re-education;Manual techniques;Dry needling;Passive range of motion;Patient/family education;Taping    PT Next Visit Plan  try to reintroduce gentle strengthening with band in supine as able; cervical and upper thoracic sof tissue and joint mobs, update goals    PT Home Exercise Plan  Access  Code: [email or web address]    Consulted and Agree with Plan of Care  Patient       Patient will benefit from skilled therapeutic intervention in order to improve the following deficits and impairments:  Pain, Increased fascial restricitons, Increased muscle spasms, Impaired tone, Postural dysfunction, Decreased strength, Decreased range of motion  Visit Diagnosis: 1. Cervicalgia   2. Abnormal posture   3. Muscle weakness (generalized)        Problem List There are no active problems to display for this patient.   Brayton CavesJakki L Chenita Ruda, PT 05/31/2019, 4:56 PM  [facility] [address], [address] [place], [place], [address] Phone: [phone]   Fax:  [phone]  Name: Cheryl Schmitt MRN: [id number] Date of Birth: 1951/07/09

## 2019-06-02 ENCOUNTER — Encounter: Payer: Medicare Other | Admitting: Physical Therapy

## 2019-06-06 ENCOUNTER — Other Ambulatory Visit: Payer: Self-pay

## 2019-06-06 ENCOUNTER — Ambulatory Visit: Payer: Medicare Other | Admitting: Physical Therapy

## 2019-06-06 ENCOUNTER — Encounter: Payer: Self-pay | Admitting: Physical Therapy

## 2019-06-06 DIAGNOSIS — M542 Cervicalgia: Secondary | ICD-10-CM

## 2019-06-06 DIAGNOSIS — M6281 Muscle weakness (generalized): Secondary | ICD-10-CM

## 2019-06-06 DIAGNOSIS — R293 Abnormal posture: Secondary | ICD-10-CM

## 2019-06-06 NOTE — Patient Instructions (Signed)
Access Code: [email or web address]  URL: [email or web address]  Date: 06/06/2019  Prepared by: Jari Favre   Exercises  Seated Cervical Rotation AROM - 10 reps - 3 sets - 1x daily - 7x weekly  Seated Cervical Flexion AROM - 10 reps - 3 sets - 1x daily - 7x weekly  Seated Cervical Extension AROM - 10 reps - 3 sets - 1x daily - 7x weekly  Supine Chin Tuck - 5 reps - 1 sets - 5 sec hold - 1x daily - 7x weekly  Supine Cervical Sidebending Stretch - 10 reps - 1 sets - 1x daily - 7x weekly  Supine Head Nod Deep Neck Flexor Training - 10 reps - 1 sets - 5 sec hold - 1x daily - 7x weekly  Sternocleidomastoid Stretch - 3 reps - 1 sets - 30 sec hold - 1x daily - 7x weekly

## 2019-06-06 NOTE — Therapy (Signed)
[facility] [address], [place], [place], [address] Phone: [phone]   Fax:  [phone]  Physical Therapy Treatment  Patient Details  Name: Cheryl Schmitt MRN: [id number] Date of Birth: [birth year] Referring Provider (PT): Cheryl Blase, MD   Encounter Date: 06/06/2019  PT End of Session - 06/06/19 4193    Visit Number  7    Date for PT Re-Evaluation  07/11/19    Authorization Type  [employer] medicare    PT Start Time  7902    PT Stop Time  1520    PT Time Calculation (min)  43 min    Activity Tolerance  Patient tolerated treatment well;No increased pain    Behavior During Therapy  WFL for tasks assessed/performed       Past Medical History:  Diagnosis Date  . Depression     History reviewed. No pertinent surgical history.  There were no vitals filed for this visit.  Subjective Assessment - 06/06/19 1440    Subjective  I feel a little better overall.  I have been able to sleep better with the towel behind my head.    Currently in Pain?  Yes    Pain Score  3     Pain Location  Neck    Pain Orientation  Right    Pain Descriptors / Indicators  Sore    Pain Type  Acute pain    Pain Radiating Towards  tingles down the arm to the finger tips    Pain Onset  More than a month ago    Pain Frequency  Intermittent    Aggravating Factors   doing things with the right arm    Multiple Pain Sites  No                       [facility] Adult PT Treatment/Exercise - 06/06/19 0001      Neck Exercises: Supine   Shoulder Abduction Limitations  horizontal abduction - yellow band    Upper Extremity D1  Extension;15 reps;Theraband    Theraband Level (UE D1)  Level 1 (Yellow)             PT Education - 06/06/19 1522    Education Details  Access Code: [email or web address]    Person(s) Educated  Patient    Methods  Explanation;Demonstration;Handout;Verbal cues    Comprehension  Verbalized understanding;Returned  demonstration       PT Short Term Goals - 06/06/19 1526      PT SHORT TERM GOAL #1   Title  ind with initial HEP    Status  Achieved      PT SHORT TERM GOAL #3   Title  Pt will have no radicular symptoms down her arm due to reduced muscle spasms    Baseline  I don't notice it but it is always there when I think about it    Status  On-going        PT Long Term Goals - 05/16/19 1953      PT LONG TERM GOAL #1   Title  Pt will be able to take a bath due to increased UE strength    Baseline  cannot push up out of the tub    Time  8    Period  Weeks    Status  New    Target Date  07/11/19      PT LONG TERM GOAL #2   Title  Pt will demonstrate at  least 50 deg of cervical rotation bilat for improved ability to look behind when driving    Time  8    Period  Weeks    Status  New    Target Date  07/11/19      PT LONG TERM GOAL #3   Title  Pt will report at least 60% less pain when cleaning her home    Time  8    Period  Weeks    Status  New    Target Date  07/11/19      PT LONG TERM GOAL #4   Title  Pt will report < or = 40% limitation on FOTO    Time  8    Period  Weeks    Status  New    Target Date  07/11/19            Plan - 06/06/19 1522    Clinical Impression Statement  Pt did well with band exercises today.  She has improved cervical alignment and needed less manual to upper cervical.  She did have tension and trigger points in rhomboids, low trap, and levator.  Pt aslo had tight SCM and scalenes on Rt side.  Pt responded well to manual techniques and dry needling.  She was also given yellow band for exercises at home in order to be able to perform exercises correctly.  Pt will benefit from skilled PT to improve strength and posture and reduce muscle spasms.    PT Treatment/Interventions  ADLs/Self Care Home Management;Cryotherapy;Electrical Stimulation;Moist Heat;Traction;Therapeutic activities;Therapeutic exercise;Neuromuscular re-education;Manual techniques;Dry  needling;Passive range of motion;Patient/family education;Taping    PT Next Visit Plan  continue gentle strengthening with band in supine as able; cervical and upper thoracic sof tissue and joint mobs    PT Home Exercise Plan  Access Code: [email or web address]       Patient will benefit from skilled therapeutic intervention in order to improve the following deficits and impairments:  Pain, Increased fascial restricitons, Increased muscle spasms, Impaired tone, Postural dysfunction, Decreased strength, Decreased range of motion  Visit Diagnosis: 1. Cervicalgia   2. Abnormal posture   3. Muscle weakness (generalized)        Problem List There are no active problems to display for this patient.   Junious SilkJakki L Lorenia Schmitt, PT 06/06/2019, 3:27 PM  [facility] [address], [address] [place], [place], [address] Phone: [phone]   Fax:  [phone]  Name: Cheryl GobbleMartha Schmitt MRN: [id number] Date of Birth: Dec 31, 1950

## 2019-06-08 ENCOUNTER — Other Ambulatory Visit: Payer: Self-pay

## 2019-06-08 ENCOUNTER — Ambulatory Visit: Payer: Medicare Other | Admitting: Physical Therapy

## 2019-06-08 ENCOUNTER — Encounter: Payer: Self-pay | Admitting: Physical Therapy

## 2019-06-08 DIAGNOSIS — M542 Cervicalgia: Secondary | ICD-10-CM | POA: Diagnosis not present

## 2019-06-08 DIAGNOSIS — M6281 Muscle weakness (generalized): Secondary | ICD-10-CM

## 2019-06-08 DIAGNOSIS — R293 Abnormal posture: Secondary | ICD-10-CM

## 2019-06-08 NOTE — Therapy (Signed)
[facility] [address], [place], [place], [address] Phone: [phone]   Fax:  [phone]  Physical Therapy Treatment  Patient Details  Name: Cheryl Schmitt MRN: [id number] Date of Birth: [birth year] Referring Provider (PT): Eunice Blase, MD   Encounter Date: 06/08/2019  PT End of Session - 06/08/19 1433    Visit Number  8    Date for PT Re-Evaluation  07/11/19    Authorization Type  [employer] medicare    PT Start Time  9678    PT Stop Time  1513    PT Time Calculation (min)  40 min    Activity Tolerance  Patient tolerated treatment well;No increased pain    Behavior During Therapy  WFL for tasks assessed/performed       Past Medical History:  Diagnosis Date  . Depression     History reviewed. No pertinent surgical history.  There were no vitals filed for this visit.  Subjective Assessment - 06/08/19 1438    Subjective  I just feel some soreness where we did the needling, no pain.    Currently in Pain?  No/denies         [facility] PT Assessment - 06/08/19 0001      AROM   Cervical - Right Rotation  50    Cervical - Left Rotation  40      Strength   Right Shoulder Flexion  4+/5    Right Shoulder ABduction  4+/5    Left Shoulder Flexion  5/5    Left Shoulder ABduction  4+/5                   [facility] Adult PT Treatment/Exercise - 06/08/19 0001      Neck Exercises: Seated   Other Seated Exercise  thoracic and cervical flexion and ext - 15x each sitting upright and rolling on blue swiss ball - 15 x each    Other Seated Exercise  SCM stretch - 3 x 20 sec each way   needs cues for the first 2 for correct positioning     Neck Exercises: Supine   Shoulder ABduction  Both;15 reps;Limitations   horizontal abduction yellow band   Upper Extremity D1  Extension;15 reps;Theraband    Theraband Level (UE D1)  Level 1 (Yellow)    Other Supine Exercise  open book stretch  - 10x; shouler flexion    Other  Supine Exercise  serratus punches- 3lb - 15x each way      Manual Therapy   Soft tissue mobilization  CPA grade III 3 bouts x20sec C3-7, cervical paraspinals, suboccipitals               PT Short Term Goals - 06/08/19 1434      PT SHORT TERM GOAL #1   Title  ind with initial HEP    Status  Achieved      PT SHORT TERM GOAL #2   Title  cervical ROM improved to at least 45 deg bilat    Baseline  50 deg Rt; 40 deg to Lt    Status  Partially Met      PT SHORT TERM GOAL #3   Title  Pt will have no radicular symptoms down her arm due to reduced muscle spasms    Baseline  I don't notice it but it is always there when I think about it    Status  On-going        PT Long Term Goals -  05/16/19 1953      PT LONG TERM GOAL #1   Title  Pt will be able to take a bath due to increased UE strength    Baseline  cannot push up out of the tub    Time  8    Period  Weeks    Status  New    Target Date  07/11/19      PT LONG TERM GOAL #2   Title  Pt will demonstrate at least 50 deg of cervical rotation bilat for improved ability to look behind when driving    Time  8    Period  Weeks    Status  New    Target Date  07/11/19      PT LONG TERM GOAL #3   Title  Pt will report at least 60% less pain when cleaning her home    Time  8    Period  Weeks    Status  New    Target Date  07/11/19      PT LONG TERM GOAL #4   Title  Pt will report < or = 40% limitation on FOTO    Time  8    Period  Weeks    Status  New    Target Date  07/11/19            Plan - 06/08/19 1526    Clinical Impression Statement  Patient was able to add more exercises in today's session.  She is still doing exercises supine for improved cervical support.  Pt deomonstrates improved cervical rotation to the Rt.  She is still more stiff to the left.  Pt will continue to benefit from skilled PT to progress strength and be able to maintain posture during functional acitvities.    PT Treatment/Interventions   ADLs/Self Care Home Management;Cryotherapy;Electrical Stimulation;Moist Heat;Traction;Therapeutic activities;Therapeutic exercise;Neuromuscular re-education;Manual techniques;Dry needling;Passive range of motion;Patient/family education;Taping    PT Next Visit Plan  transition band and posture exercises to sitting; continue manual and dry needling to rhomboids, levator and thoracic multifidi as needed    PT Home Exercise Plan  Access Code: [email or web address]    Consulted and Agree with Plan of Care  Patient       Patient will benefit from skilled therapeutic intervention in order to improve the following deficits and impairments:  Pain, Increased fascial restricitons, Increased muscle spasms, Impaired tone, Postural dysfunction, Decreased strength, Decreased range of motion  Visit Diagnosis: 1. Cervicalgia   2. Abnormal posture   3. Muscle weakness (generalized)        Problem List There are no active problems to display for this patient.   Jule Ser, PT 06/08/2019, 3:30 PM  [facility] [address], [place], [place], [address] Phone: [phone]   Fax:  [phone]  Name: Tamaka Sawin MRN: [id number] Date of Birth: [birth year]

## 2019-06-13 ENCOUNTER — Ambulatory Visit: Payer: Medicare Other | Admitting: Physical Therapy

## 2019-06-15 ENCOUNTER — Ambulatory Visit: Payer: Medicare Other | Admitting: Physical Therapy

## 2019-06-16 ENCOUNTER — Ambulatory Visit
Admission: RE | Admit: 2019-06-16 | Discharge: 2019-06-16 | Disposition: A | Discharge status: Home / Self Care | Payer: Medicare Other | Source: Ambulatory Visit | Attending: Family Medicine | Admitting: Family Medicine

## 2019-06-16 ENCOUNTER — Other Ambulatory Visit: Payer: Self-pay

## 2019-06-16 DIAGNOSIS — M542 Cervicalgia: Secondary | ICD-10-CM

## 2019-06-19 ENCOUNTER — Telehealth: Payer: Self-pay | Admitting: Family Medicine

## 2019-06-19 NOTE — Telephone Encounter (Signed)
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  There is moderate narrowing on the left at C7-T1 for similar reasons.  No definite need for surgery yet. If therapy is helping, we can keep doing that.  If not, could refer to [facility] for an epidural injection.

## 2019-06-19 NOTE — Telephone Encounter (Signed)
Left message on patient's voice mail to call back regarding MRI results.

## 2019-06-20 ENCOUNTER — Telehealth: Payer: Self-pay | Admitting: Family Medicine

## 2019-06-20 ENCOUNTER — Encounter: Payer: Self-pay | Admitting: Physical Therapy

## 2019-06-20 ENCOUNTER — Other Ambulatory Visit: Payer: Self-pay

## 2019-06-20 ENCOUNTER — Ambulatory Visit: Payer: Medicare Other | Attending: Family Medicine | Admitting: Physical Therapy

## 2019-06-20 DIAGNOSIS — R293 Abnormal posture: Secondary | ICD-10-CM | POA: Insufficient documentation

## 2019-06-20 DIAGNOSIS — M542 Cervicalgia: Secondary | ICD-10-CM | POA: Diagnosis present

## 2019-06-20 DIAGNOSIS — M6281 Muscle weakness (generalized): Secondary | ICD-10-CM | POA: Insufficient documentation

## 2019-06-20 NOTE — Telephone Encounter (Signed)
Patient returned your phone call.  CB#[phone].  Thank you.

## 2019-06-20 NOTE — Telephone Encounter (Signed)
I called the patient - see other message regarding MRI results. 

## 2019-06-20 NOTE — Therapy (Signed)
[facility] [address], [address] [place], [place], [address] Phone: [phone]   Fax:  [phone]  Physical Therapy Treatment  Patient Details  Name: Cheryl Schmitt MRN: [id number] Date of Birth: 01/29/1951 Referring Provider (PT): Hilts, Michael, MD   Encounter Date: 06/20/2019  PT End of Session - 06/20/19 1538    Visit Number  9    Date for PT Re-Evaluation  07/11/19    Authorization Type  [employer] medicare    PT Start Time  1533    PT Stop Time  1621    PT Time Calculation (min)  48 min    Activity Tolerance  Patient tolerated treatment well;No increased pain    Behavior During Therapy  WFL for tasks assessed/performed       Past Medical History:  Diagnosis Date  . Depression     History reviewed. No pertinent surgical history.  There were no vitals filed for this visit.  Subjective Assessment - 06/20/19 1627    Subjective  I missed the dry needling last week.  Overall, it feels better and I don't notice the tingling all the time anymore.    Patient Stated Goals  Take away the pain    Currently in Pain?  No/denies         [facility] PT Assessment - 06/20/19 0001      AROM   Cervical - Right Rotation  50    Cervical - Left Rotation  40                   [facility] Adult PT Treatment/Exercise - 06/20/19 0001      Shoulder Exercises: ROM/Strengthening   UBE (Upper Arm Bike)  L2 standing 2x2      Manual Therapy   Soft tissue mobilization  cervical paraspinals, subocciptials, SCM, along T1-T4, upper trap and scalenes    Manual Traction  5 min to cervical spine       Trigger Point Dry Needling - 06/20/19 0001    Consent Given?  Yes    Education Handout Provided  Previously provided    Other Dry Needling  T4-T8 multfidi Rt side    Levator Scapulae Response  Twitch response elicited;Palpable increased muscle length    Cervical multifidi Response  Twitch reponse elicited;Palpable increased muscle length     Rhomboids Response  Twitch response elicited;Palpable increased muscle length    Lower trapezius Response  Twitch response elicited;Palpable increased muscle length             PT Short Term Goals - 06/08/19 1434      PT SHORT TERM GOAL #1   Title  ind with initial HEP    Status  Achieved      PT SHORT TERM GOAL #2   Title  cervical ROM improved to at least 45 deg bilat    Baseline  50 deg Rt; 40 deg to Lt    Status  Partially Met      PT SHORT TERM GOAL #3   Title  Pt will have no radicular symptoms down her arm due to reduced muscle spasms    Baseline  I don't notice it but it is always there when I think about it    Status  On-going        PT Long Term Goals - 05/16/19 1953      PT LONG TERM GOAL #1   Title  Pt will be able to take a bath due to increased   UE strength    Baseline  cannot push up out of the tub    Time  8    Period  Weeks    Status  New    Target Date  07/11/19      PT LONG TERM GOAL #2   Title  Pt will demonstrate at least 50 deg of cervical rotation bilat for improved ability to look behind when driving    Time  8    Period  Weeks    Status  New    Target Date  07/11/19      PT LONG TERM GOAL #3   Title  Pt will report at least 60% less pain when cleaning her home    Time  8    Period  Weeks    Status  New    Target Date  07/11/19      PT LONG TERM GOAL #4   Title  Pt will report < or = 40% limitation on FOTO    Time  8    Period  Weeks    Status  New    Target Date  07/11/19            Plan - 06/20/19 1623    Clinical Impression Statement  Today's session focues on muscle lengthening.  She has been noticing a difference with the manual and dry needling techniques and not having the tingling all the time.  Pt had good myofascial release with manual traction.  Pt will continue to benefit from skilled PT to progress posture and strength for improved functional activities with greater ease.    PT Treatment/Interventions   ADLs/Self Care Home Management;Cryotherapy;Electrical Stimulation;Moist Heat;Traction;Therapeutic activities;Therapeutic exercise;Neuromuscular re-education;Manual techniques;Dry needling;Passive range of motion;Patient/family education;Taping    PT Next Visit Plan  transition band and posture exercises to sitting; continue manual and dry needling to rhomboids, levator and thoracic multifidi as needed    PT Home Exercise Plan  Access Code: [email or web address]    Consulted and Agree with Plan of Care  Patient       Patient will benefit from skilled therapeutic intervention in order to improve the following deficits and impairments:  Pain, Increased fascial restricitons, Increased muscle spasms, Impaired tone, Postural dysfunction, Decreased strength, Decreased range of motion  Visit Diagnosis: 1. Cervicalgia   2. Abnormal posture   3. Muscle weakness (generalized)        Problem List There are no active problems to display for this patient.   Jule Ser, PT 06/20/2019, 4:27 PM  [facility] [address] [place], [place], [address] Phone: [phone]   Fax:  [phone]  Name: Seth Friedlander MRN: [id number] Date of Birth: 10/11/1951

## 2019-06-20 NOTE — Telephone Encounter (Signed)
Results given to the patient. She says PT is helping her - will continue this. Mailing a copy of the MRI, + Dr. Junius Roads' note about the MRI, to the patient per request.

## 2019-06-22 ENCOUNTER — Other Ambulatory Visit: Payer: Self-pay

## 2019-06-22 ENCOUNTER — Ambulatory Visit: Payer: Medicare Other | Admitting: Physical Therapy

## 2019-06-22 ENCOUNTER — Encounter: Payer: Self-pay | Admitting: Physical Therapy

## 2019-06-22 DIAGNOSIS — M6281 Muscle weakness (generalized): Secondary | ICD-10-CM

## 2019-06-22 DIAGNOSIS — R293 Abnormal posture: Secondary | ICD-10-CM

## 2019-06-22 DIAGNOSIS — M542 Cervicalgia: Secondary | ICD-10-CM | POA: Diagnosis not present

## 2019-06-22 NOTE — Therapy (Signed)
[facility] [address] [place], [place], [address] Phone: [phone]   Fax:  [phone]  Physical Therapy Treatment  Patient Details  Name: Cheryl Schmitt MRN: [id number] Date of Birth: Oct 13, 1951 Referring Provider (PT): Eunice Blase, MD   Encounter Date: 06/22/2019  PT End of Session - 06/22/19 1605    Visit Number  10    Date for PT Re-Evaluation  07/11/19    PT Start Time  7989    PT Stop Time  1612    PT Time Calculation (min)  39 min    Activity Tolerance  Patient tolerated treatment well;No increased pain    Behavior During Therapy  WFL for tasks assessed/performed       Past Medical History:  Diagnosis Date  . Depression     History reviewed. No pertinent surgical history.  There were no vitals filed for this visit.  Subjective Assessment - 06/22/19 1609    Subjective  It is feeling good today.  I have been doing the band exercises and trying to figure out how to improve my posture.    Patient Stated Goals  Take away the pain    Currently in Pain?  No/denies                       [facility] Adult PT Treatment/Exercise - 06/22/19 0001      Self-Care   Self-Care  Posture    Posture  instructed in posture using mirror for visual cues on alignment of ear, shoulder, hip, ankle - cues throughout session on maintaining posture      Neck Exercises: Supine   Shoulder ABduction  Both;15 reps;Limitations   horizontal abduction red band   Upper Extremity D1  Extension;15 reps;Theraband    Theraband Level (UE D1)  Level 1 (Yellow)      Shoulder Exercises: Standing   Extension  Strengthening;Both;20 reps;Theraband    Theraband Level (Shoulder Extension)  Level 2 (Red)    Row  Strengthening;Both;20 reps;Theraband    Theraband Level (Shoulder Row)  Level 4 (Blue)    Other Standing Exercises  pallof punch with yellow band - 10x each way with focus on posture    Other Standing Exercises   serratus push on wall with cues to keep shoulders down and chin tuck      Shoulder Exercises: ROM/Strengthening   UBE (Upper Arm Bike)  L2 standing 2x2    Other ROM/Strengthening Exercises  ball roll out green ball - spine flexion/extension and side bends - 3 x 10 sec each way             PT Education - 06/22/19 1601    Education Details  Access Code: [email or web address]    Person(s) Educated  Patient    Methods  Explanation;Demonstration;Verbal cues;Handout    Comprehension  Verbalized understanding;Returned demonstration       PT Short Term Goals - 06/08/19 1434      PT SHORT TERM GOAL #1   Title  ind with initial HEP    Status  Achieved      PT SHORT TERM GOAL #2   Title  cervical ROM improved to at least 45 deg bilat    Baseline  50 deg Rt; 40 deg to Lt    Status  Partially Met      PT SHORT TERM GOAL #3   Title  Pt will have no radicular symptoms down her arm due to reduced muscle spasms  Baseline  I don't notice it but it is always there when I think about it    Status  On-going        PT Long Term Goals - 05/16/19 1953      PT LONG TERM GOAL #1   Title  Pt will be able to take a bath due to increased UE strength    Baseline  cannot push up out of the tub    Time  8    Period  Weeks    Status  New    Target Date  07/11/19      PT LONG TERM GOAL #2   Title  Pt will demonstrate at least 50 deg of cervical rotation bilat for improved ability to look behind when driving    Time  8    Period  Weeks    Status  New    Target Date  07/11/19      PT LONG TERM GOAL #3   Title  Pt will report at least 60% less pain when cleaning her home    Time  8    Period  Weeks    Status  New    Target Date  07/11/19      PT LONG TERM GOAL #4   Title  Pt will report < or = 40% limitation on FOTO    Time  8    Period  Weeks    Status  New    Target Date  07/11/19            Plan - 06/22/19 1639    Clinical Impression Statement  Pt was able to progress strengthening  today.  She was able to perform standing posture correctly during exercises with verbal and visual cues for shoulders relaxed and chin tuck.  Pt has some winging scap Rt>Lt.  She did serratus push on wall correctly and added to program for home. Continue according to POC for max function.    PT Treatment/Interventions  ADLs/Self Care Home Management;Cryotherapy;Electrical Stimulation;Moist Heat;Traction;Therapeutic activities;Therapeutic exercise;Neuromuscular re-education;Manual techniques;Dry needling;Passive range of motion;Patient/family education;Taping    PT Next Visit Plan  continue band and posture exercises standing and sitting; serratus punches, continue manual and dry needling to rhomboids, levator and thoracic multifidi as needed    PT Home Exercise Plan  Access Code: [email or web address]    Consulted and Agree with Plan of Care  Patient       Patient will benefit from skilled therapeutic intervention in order to improve the following deficits and impairments:  Pain, Increased fascial restricitons, Increased muscle spasms, Impaired tone, Postural dysfunction, Decreased strength, Decreased range of motion  Visit Diagnosis: 1. Cervicalgia   2. Abnormal posture   3. Muscle weakness (generalized)        Problem List There are no active problems to display for this patient.   Jule Ser, PT 06/22/2019, 5:14 PM  [facility] [address] [place], [place], [address] Phone: [phone]   Fax:  [phone]  Name: Cheryl Schmitt MRN: [id number] Date of Birth: 08-Feb-1951

## 2019-06-22 NOTE — Patient Instructions (Signed)
Access Code: [email or web address]  URL: [email or web address]  Date: 06/22/2019  Prepared by: Jari Favre   Exercises  Seated Cervical Rotation AROM - 10 reps - 3 sets - 1x daily - 7x weekly  Seated Cervical Flexion AROM - 10 reps - 3 sets - 1x daily - 7x weekly  Seated Cervical Extension AROM - 10 reps - 3 sets - 1x daily - 7x weekly  Supine Chin Tuck - 5 reps - 1 sets - 5 sec hold - 1x daily - 7x weekly  Supine Cervical Sidebending Stretch - 10 reps - 1 sets - 1x daily - 7x weekly  Supine Head Nod Deep Neck Flexor Training - 10 reps - 1 sets - 5 sec hold - 1x daily - 7x weekly  Sternocleidomastoid Stretch - 3 reps - 1 sets - 30 sec hold - 1x daily - 7x weekly  Shoulder extension with resistance - Neutral - 10 reps - 3 sets - 1x daily - 7x weekly  Standing Shoulder Row with Anchored Resistance - 10 reps - 3 sets - 1x daily - 7x weekly  Supine Shoulder Horizontal Abduction with Resistance - 10 reps - 3 sets - 1x daily - 7x weekly

## 2019-06-27 ENCOUNTER — Other Ambulatory Visit: Payer: Self-pay

## 2019-06-27 ENCOUNTER — Ambulatory Visit: Payer: Medicare Other | Admitting: Physical Therapy

## 2019-06-27 ENCOUNTER — Encounter: Payer: Self-pay | Admitting: Physical Therapy

## 2019-06-27 DIAGNOSIS — M6281 Muscle weakness (generalized): Secondary | ICD-10-CM

## 2019-06-27 DIAGNOSIS — M542 Cervicalgia: Secondary | ICD-10-CM

## 2019-06-27 DIAGNOSIS — R293 Abnormal posture: Secondary | ICD-10-CM

## 2019-06-27 NOTE — Patient Instructions (Signed)
Access Code: [email or web address]  URL: [email or web address]  Date: 06/27/2019  Prepared by: Jari Favre   Exercises  Seated Cervical Rotation AROM - 10 reps - 3 sets - 1x daily - 7x weekly  Seated Cervical Flexion AROM - 10 reps - 3 sets - 1x daily - 7x weekly  Seated Cervical Extension AROM - 10 reps - 3 sets - 1x daily - 7x weekly  Supine Chin Tuck - 5 reps - 1 sets - 5 sec hold - 1x daily - 7x weekly  Supine Cervical Sidebending Stretch - 10 reps - 1 sets - 1x daily - 7x weekly  Supine Head Nod Deep Neck Flexor Training - 10 reps - 1 sets - 5 sec hold - 1x daily - 7x weekly  Sternocleidomastoid Stretch - 3 reps - 1 sets - 30 sec hold - 1x daily - 7x weekly  Shoulder extension with resistance - Neutral - 10 reps - 3 sets - 1x daily - 7x weekly  Standing Shoulder Row with Anchored Resistance - 10 reps - 3 sets - 1x daily - 7x weekly  Supine Shoulder Horizontal Abduction with Resistance - 10 reps - 3 sets - 1x daily - 7x weekly  Standing Plank on Wall - 10 reps - 2 sets - 1x daily - 7x weekly  Seated Thoracic Flexion and Rotation with Arms Crossed - 10 reps - 1 sets - 5 sec hold - 1x daily - 7x weekly

## 2019-06-27 NOTE — Therapy (Signed)
[facility] [address] [place], [place], [address] Phone: [phone]   Fax:  [phone]  Physical Therapy Treatment  Patient Details  Name: Cheryl Schmitt MRN: [id number] Date of Birth: September 17, 1951 Referring Provider (PT): Eunice Blase, MD   Encounter Date: 06/27/2019  PT End of Session - 06/27/19 1454    Visit Number  11    Date for PT Re-Evaluation  07/11/19    Authorization Type  [employer] medicare    PT Start Time  1450    PT Stop Time  4801    PT Time Calculation (min)  40 min    Activity Tolerance  Patient tolerated treatment well;No increased pain    Behavior During Therapy  WFL for tasks assessed/performed       Past Medical History:  Diagnosis Date  . Depression     History reviewed. No pertinent surgical history.  There were no vitals filed for this visit.  Subjective Assessment - 06/27/19 1455    Subjective  I'm feeling good today since taking anti-inflammatory the doctor gave me again.  Overall, it's just stiff and I feel like this is helping.    Patient Stated Goals  Take away the pain    Currently in Pain?  No/denies                       [facility] Adult PT Treatment/Exercise - 06/27/19 0001      Neck Exercises: Supine   Other Supine Exercise  serratus punches- 4lb - 15x each way      Shoulder Exercises: Standing   Other Standing Exercises  foam roller at the wall - posture, scap squeezes, cervical retraction, diagonals, T, bilat ER - yellow band - 20x each      Shoulder Exercises: ROM/Strengthening   UBE (Upper Arm Bike)  L2 sitting 2x2   PT present for status update     Manual Therapy   Soft tissue mobilization  cervical paraspinals, subocciptials, SCM, along T1-T4, upper trap and scalenes    Manual Traction  5 min to cervical spine             PT Education - 06/27/19 1535    Education Details  Access Code: [email or web address]    Person(s) Educated  Patient    Methods   Explanation;Demonstration    Comprehension  Verbalized understanding;Returned demonstration       PT Short Term Goals - 06/08/19 1434      PT SHORT TERM GOAL #1   Title  ind with initial HEP    Status  Achieved      PT SHORT TERM GOAL #2   Title  cervical ROM improved to at least 45 deg bilat    Baseline  50 deg Rt; 40 deg to Lt    Status  Partially Met      PT SHORT TERM GOAL #3   Title  Pt will have no radicular symptoms down her arm due to reduced muscle spasms    Baseline  I don't notice it but it is always there when I think about it    Status  On-going        PT Long Term Goals - 05/16/19 1953      PT LONG TERM GOAL #1   Title  Pt will be able to take a bath due to increased UE strength    Baseline  cannot push up out of the tub    Time  8    Period  Weeks    Status  New    Target Date  07/11/19      PT LONG TERM GOAL #2   Title  Pt will demonstrate at least 50 deg of cervical rotation bilat for improved ability to look behind when driving    Time  8    Period  Weeks    Status  New    Target Date  07/11/19      PT LONG TERM GOAL #3   Title  Pt will report at least 60% less pain when cleaning her home    Time  8    Period  Weeks    Status  New    Target Date  07/11/19      PT LONG TERM GOAL #4   Title  Pt will report < or = 40% limitation on FOTO    Time  8    Period  Weeks    Status  New    Target Date  07/11/19            Plan - 06/27/19 1514    Clinical Impression Statement  Pt did well with strengthening in standing today.  She was able to tolerate exercise progressions.  Overall, her neck is improving.  She responded well to joint mobs to thoracic spine and soft tissue release with instrument assisted techniques.  She was able to get a better stretch to the low trap on the Rt side that continues to spasm.  Pt will benefit from skilled PT to continue to working on improved soft tissue and posture for reduced muscle spasms.    PT  Treatment/Interventions  ADLs/Self Care Home Management;Cryotherapy;Electrical Stimulation;Moist Heat;Traction;Therapeutic activities;Therapeutic exercise;Neuromuscular re-education;Manual techniques;Dry needling;Passive range of motion;Patient/family education;Taping    PT Next Visit Plan  continue band and posture exercises standing and sitting; serratus punches, continue manual and dry needling to rhomboids, levator and thoracic multifidi as needed    PT Home Exercise Plan  Access Code: [email or web address]    Consulted and Agree with Plan of Care  Patient       Patient will benefit from skilled therapeutic intervention in order to improve the following deficits and impairments:  Pain, Increased fascial restricitons, Increased muscle spasms, Impaired tone, Postural dysfunction, Decreased strength, Decreased range of motion  Visit Diagnosis: 1. Cervicalgia   2. Abnormal posture   3. Muscle weakness (generalized)        Problem List There are no active problems to display for this patient.   Camillo Flaming Rubin Dais, PT 06/27/2019, 3:38 PM  [facility] [address], [place], [place], [address] Phone: [phone]   Fax:  [phone]  Name: Graylyn Bunney MRN: [id number] Date of Birth: [birth year]

## 2019-06-29 ENCOUNTER — Other Ambulatory Visit: Payer: Self-pay

## 2019-06-29 ENCOUNTER — Encounter: Payer: Self-pay | Admitting: Physical Therapy

## 2019-06-29 ENCOUNTER — Ambulatory Visit: Payer: Medicare Other | Admitting: Physical Therapy

## 2019-06-29 DIAGNOSIS — M542 Cervicalgia: Secondary | ICD-10-CM | POA: Diagnosis not present

## 2019-06-29 DIAGNOSIS — M6281 Muscle weakness (generalized): Secondary | ICD-10-CM

## 2019-06-29 DIAGNOSIS — R293 Abnormal posture: Secondary | ICD-10-CM

## 2019-06-29 NOTE — Therapy (Signed)
[facility] [address], [place], [place], [address] Phone: [phone]   Fax:  [phone]  Physical Therapy Treatment  Patient Details  Name: Cheryl Schmitt MRN: [id number] Date of Birth: July 28, 1951 Referring Provider (PT): Eunice Blase, MD   Encounter Date: 06/29/2019  PT End of Session - 06/29/19 1540    Visit Number  12    Date for PT Re-Evaluation  07/11/19    Authorization Type  [employer] medicare    PT Start Time  5916    PT Stop Time  3846    PT Time Calculation (min)  39 min    Activity Tolerance  Patient tolerated treatment well;No increased pain    Behavior During Therapy  WFL for tasks assessed/performed       Past Medical History:  Diagnosis Date  . Depression     History reviewed. No pertinent surgical history.  There were no vitals filed for this visit.  Subjective Assessment - 06/29/19 1718    Subjective  I was sore after previous session.  Overall still much better.  I have been working on the band exercises this week.  I don't want to be sore for my trip to the mountains this weekned.    Currently in Pain?  Yes    Pain Score  2     Pain Location  Scapula    Pain Orientation  Right    Pain Descriptors / Indicators  Sore;Spasm    Pain Type  Acute pain    Pain Onset  More than a month ago    Multiple Pain Sites  No                       [facility] Adult PT Treatment/Exercise - 06/29/19 0001      Shoulder Exercises: ROM/Strengthening   UBE (Upper Arm Bike)  L2 sitting 2x2   PT present for status update - cues for posture     Manual Therapy   Joint Mobilization  T1-T5 P-A mobilization grade 3 in prone    Soft tissue mobilization  periscapular muslce, teres minor and infraspinatus, upper trap, cervical paraspinlas, suboccipitals    Myofascial Release  suboccipital release               PT Short Term Goals - 06/08/19 1434      PT SHORT TERM GOAL #1   Title  ind with  initial HEP    Status  Achieved      PT SHORT TERM GOAL #2   Title  cervical ROM improved to at least 45 deg bilat    Baseline  50 deg Rt; 40 deg to Lt    Status  Partially Met      PT SHORT TERM GOAL #3   Title  Pt will have no radicular symptoms down her arm due to reduced muscle spasms    Baseline  I don't notice it but it is always there when I think about it    Status  On-going        PT Long Term Goals - 05/16/19 1953      PT LONG TERM GOAL #1   Title  Pt will be able to take a bath due to increased UE strength    Baseline  cannot push up out of the tub    Time  8    Period  Weeks    Status  New    Target Date  07/11/19  PT LONG TERM GOAL #2   Title  Pt will demonstrate at least 50 deg of cervical rotation bilat for improved ability to look behind when driving    Time  8    Period  Weeks    Status  New    Target Date  07/11/19      PT LONG TERM GOAL #3   Title  Pt will report at least 60% less pain when cleaning her home    Time  8    Period  Weeks    Status  New    Target Date  07/11/19      PT LONG TERM GOAL #4   Title  Pt will report < or = 40% limitation on FOTO    Time  8    Period  Weeks    Status  New    Target Date  07/11/19            Plan - 06/29/19 1719    Clinical Impression Statement  Today's treatment focused on soft tissue release with STM techniques and dry needling.  Pt is doing well with HEP and was sore so we did not progress strength at this time since she is going away this weekend and couldn't be sore for that.  Pt will continue to benefit from skilled Pt to progress strength and posture in order to tolerate long car rides and other ADLs without increased pain    PT Treatment/Interventions  ADLs/Self Care Home Management;Cryotherapy;Electrical Stimulation;Moist Heat;Traction;Therapeutic activities;Therapeutic exercise;Neuromuscular re-education;Manual techniques;Dry needling;Passive range of motion;Patient/family education;Taping     PT Next Visit Plan  continue band and posture exercises standing and sitting; serratus punches, continue manual and dry needling to rhomboids, levator and thoracic multifidi as needed    PT Home Exercise Plan  Access Code: [email or web address]    Consulted and Agree with Plan of Care  Patient       Patient will benefit from skilled therapeutic intervention in order to improve the following deficits and impairments:  Pain, Increased fascial restricitons, Increased muscle spasms, Impaired tone, Postural dysfunction, Decreased strength, Decreased range of motion  Visit Diagnosis: 1. Cervicalgia   2. Abnormal posture   3. Muscle weakness (generalized)        Problem List There are no active problems to display for this patient.   Jule Ser, PT 06/29/2019, 5:25 PM  [facility] [address] [place], [place], [address] Phone: [phone]   Fax:  [phone]  Name: Cheryl Schmitt MRN: [id number] Date of Birth: Jun 21, 1951

## 2019-07-04 ENCOUNTER — Ambulatory Visit: Payer: Medicare Other | Admitting: Physical Therapy

## 2019-07-06 ENCOUNTER — Ambulatory Visit: Payer: Medicare Other | Admitting: Physical Therapy

## 2019-07-06 ENCOUNTER — Other Ambulatory Visit: Payer: Self-pay

## 2019-07-06 DIAGNOSIS — R293 Abnormal posture: Secondary | ICD-10-CM

## 2019-07-06 DIAGNOSIS — M542 Cervicalgia: Secondary | ICD-10-CM

## 2019-07-06 DIAGNOSIS — M6281 Muscle weakness (generalized): Secondary | ICD-10-CM

## 2019-07-06 NOTE — Therapy (Signed)
[facility] [address], [place], [place], [address] Phone: [phone]   Fax:  [phone]  Physical Therapy Treatment  Patient Details  Name: Cheryl Schmitt MRN: [id number] Date of Birth: [birth year] Referring Provider (PT): Eunice Blase, MD   Encounter Date: 07/06/2019  PT End of Session - 07/06/19 1622    Visit Number  13    Date for PT Re-Evaluation  08/17/19    Authorization Type  [employer] medicare    PT Start Time  8638    PT Stop Time  1771    PT Time Calculation (min)  41 min    Activity Tolerance  Patient tolerated treatment well;No increased pain    Behavior During Therapy  WFL for tasks assessed/performed       Past Medical History:  Diagnosis Date  . Depression     No past surgical history on file.  There were no vitals filed for this visit.  Subjective Assessment - 07/06/19 1619    Subjective  I am taking an anti-inflammatory right now and last time I stopped taking that, my neck got more stiff again.  I am still worried that will happen.  I am planning on taking it for one more week and then see what happens.  I have no pain just stiffness and have not noticed any tingling in my hands.    Patient Stated Goals  Take away the pain    Currently in Pain?  No/denies         [facility] PT Assessment - 07/07/19 0001      Assessment   Medical Diagnosis  M54.2 (ICD-10-CM) - Cervicalgia                   [facility] Adult PT Treatment/Exercise - 07/07/19 0001      Neck Exercises: Supine   Cervical Isometrics  Extension    Shoulder ABduction  Both;20 reps    Shoulder Abduction Limitations  yellow    Upper Extremity D2  Extension;20 reps;Theraband    Theraband Level (UE D2)  Level 1 (Yellow)      Lumbar Exercises: Aerobic   Nustep  L2 x 6 min - emphasis on UE and keep core engaged   PT present for status update     Manual Therapy   Joint Mobilization  T1-T5 P-A mobilization grade 3 in prone    Soft tissue mobilization  periscapular muslce, teres minor and infraspinatus, upper trap, cervical paraspinlas, suboccipitals    Myofascial Release  suboccipital release       Trigger Point Dry Needling - 07/07/19 0001    Consent Given?  Yes    Education Handout Provided  Previously provided    Muscles Treated Head and Neck  Oblique capitus    Upper Trapezius Response  Twitch reponse elicited;Palpable increased muscle length    Oblique Capitus Response  Twitch response elicited;Palpable increased muscle length    Cervical multifidi Response  Twitch reponse elicited;Palpable increased muscle length             PT Short Term Goals - 07/07/19 1300      PT SHORT TERM GOAL #1   Title  ind with initial HEP    Status  Achieved      PT SHORT TERM GOAL #2   Title  cervical ROM improved to at least 45 deg bilat    Baseline  50 deg Rt; 40 deg to Lt    Status  Partially Met  PT SHORT TERM GOAL #3   Title  Pt will have no radicular symptoms down her arm due to reduced muscle spasms    Baseline  do not notice that anymore    Status  Achieved        PT Long Term Goals - 07/06/19 1618      PT LONG TERM GOAL #1   Title  Pt will be able to take a bath due to increased UE strength    Time  6    Period  Weeks    Status  New    Target Date  08/17/19      PT LONG TERM GOAL #2   Title  Pt will demonstrate at least 50 deg of cervical rotation bilat for improved ability to look behind when driving    Baseline  still having problems looking behind me; 50 deg Rt; 40 deg to Lt    Time  6    Period  Weeks    Status  On-going    Target Date  08/17/19      PT LONG TERM GOAL #3   Title  Pt will report at least 60% less pain when cleaning her home    Baseline  90% improved    Time  6    Period  Weeks    Status  Achieved    Target Date  08/17/19      PT LONG TERM GOAL #4   Title  Pt will report < or = 40% limitation on FOTO    Baseline  45% limited down from 51%    Time  6     Period  Weeks    Status  Partially Met    Target Date  08/17/19            Plan - 07/07/19 1254    Clinical Impression Statement  Pt shows improvement based on FOTO.  She demonstrates improved cervical rotation overall.  She was more stiff today after a lot of driving last weekend.  Overall, she denies any pain and is now just experiencing stiffness.  She no longer has tingling in her hands like she had initially experienced.  She will continue to benefit from skilled PT in order to keep working towards greater AROM so she can return to more safely being able to look as she is backing up out of parking spaces.  She will also benefit from skilled PT to continue to progress strength for greater postural control and endurance with functional activities like taking long car trips and return to maximum functional potential.    PT Treatment/Interventions  ADLs/Self Care Home Management;Cryotherapy;Electrical Stimulation;Moist Heat;Traction;Therapeutic activities;Therapeutic exercise;Neuromuscular re-education;Manual techniques;Dry needling;Passive range of motion;Patient/family education;Taping    PT Next Visit Plan  progress band and posture exercises standing and sitting; serratus punches, continue manual and dry needling to rhomboids, levator and thoracic multifidi as needed    PT Home Exercise Plan  Access Code: [email or web address]    Consulted and Agree with Plan of Care  Patient       Patient will benefit from skilled therapeutic intervention in order to improve the following deficits and impairments:  Pain, Increased fascial restricitons, Increased muscle spasms, Impaired tone, Postural dysfunction, Decreased strength, Decreased range of motion  Visit Diagnosis: Cervicalgia - Plan: PT plan of care cert/re-cert  Abnormal posture - Plan: PT plan of care cert/re-cert  Muscle weakness (generalized) - Plan: PT plan of care cert/re-cert     Problem List There are no  active problems to display for  this patient.   Jule Ser, PT 07/07/2019, 1:29 PM  [facility] [address] [place], [place], [address] Phone: [phone]   Fax:  [phone]  Name: Cheryl Schmitt MRN: [id number] Date of Birth: 18-Aug-1951

## 2019-07-11 ENCOUNTER — Ambulatory Visit: Payer: Medicare Other | Admitting: Physical Therapy

## 2019-07-11 ENCOUNTER — Other Ambulatory Visit: Payer: Self-pay

## 2019-07-11 ENCOUNTER — Encounter: Payer: Self-pay | Admitting: Physical Therapy

## 2019-07-11 ENCOUNTER — Telehealth: Payer: Self-pay | Admitting: Physical Therapy

## 2019-07-11 DIAGNOSIS — M542 Cervicalgia: Secondary | ICD-10-CM

## 2019-07-11 DIAGNOSIS — R293 Abnormal posture: Secondary | ICD-10-CM

## 2019-07-11 DIAGNOSIS — M6281 Muscle weakness (generalized): Secondary | ICD-10-CM

## 2019-07-11 NOTE — Therapy (Signed)
[facility] [address] [place], [place], [address] Phone: [phone]   Fax:  [phone]  Physical Therapy Treatment  Patient Details  Name: Cheryl Schmitt MRN: [id number] Date of Birth: 04-19-1951 Referring Provider (PT): Eunice Blase, MD   Encounter Date: 07/11/2019  PT End of Session - 07/11/19 1533    Visit Number  14    Date for PT Re-Evaluation  08/17/19    Authorization Type  [employer] medicare    PT Start Time  3833    PT Stop Time  1527    PT Time Calculation (min)  41 min    Activity Tolerance  Patient tolerated treatment well;No increased pain    Behavior During Therapy  WFL for tasks assessed/performed       Past Medical History:  Diagnosis Date  . Depression     History reviewed. No pertinent surgical history.  There were no vitals filed for this visit.  Subjective Assessment - 07/11/19 1453    Subjective  I am a little more sore after not taking the anti-inflammatory for the last few days.  Pt states she is driving to the farm out [place] and it is 3.5 hours one way.    Currently in Pain?  Yes    Pain Score  3     Pain Location  Scapula    Pain Orientation  Right    Pain Descriptors / Indicators  Sore    Pain Type  Chronic pain    Pain Onset  More than a month ago                       [facility] Adult PT Treatment/Exercise - 07/11/19 0001      Self-Care   Other Self-Care Comments   self massage to upper trap and rhomboids - ball in pillow case      Shoulder Exercises: Standing   Extension  Strengthening;Both;20 reps;Theraband    Theraband Level (Shoulder Extension)  Level 3 (Green)    Row  Strengthening;Both;20 reps;Theraband    Theraband Level (Shoulder Row)  Level 4 (Blue)      Shoulder Exercises: ROM/Strengthening   Other ROM/Strengthening Exercises  shoulder flex and abduction - 1lb with cues for posture      Manual Therapy   Soft tissue mobilization  periscapular  muslce, teres minor and infraspinatus, upper trap, cervical paraspinlas, suboccipitals    Myofascial Release  suboccipital release     backwards shoulder rolls - 10x with exaggerated movements  Trigger Point Dry Needling - 07/11/19 0001    Upper Trapezius Response  Twitch reponse elicited;Palpable increased muscle length             PT Short Term Goals - 07/07/19 1300      PT SHORT TERM GOAL #1   Title  ind with initial HEP    Status  Achieved      PT SHORT TERM GOAL #2   Title  cervical ROM improved to at least 45 deg bilat    Baseline  50 deg Rt; 40 deg to Lt    Status  Partially Met      PT SHORT TERM GOAL #3   Title  Pt will have no radicular symptoms down her arm due to reduced muscle spasms    Baseline  do not notice that anymore    Status  Achieved        PT Long Term Goals - 07/06/19 3832  PT LONG TERM GOAL #1   Title  Pt will be able to take a bath due to increased UE strength    Time  6    Period  Weeks    Status  New    Target Date  08/17/19      PT LONG TERM GOAL #2   Title  Pt will demonstrate at least 50 deg of cervical rotation bilat for improved ability to look behind when driving    Baseline  still having problems looking behind me; 50 deg Rt; 40 deg to Lt    Time  6    Period  Weeks    Status  On-going    Target Date  08/17/19      PT LONG TERM GOAL #3   Title  Pt will report at least 60% less pain when cleaning her home    Baseline  90% improved    Time  6    Period  Weeks    Status  Achieved    Target Date  08/17/19      PT LONG TERM GOAL #4   Title  Pt will report < or = 40% limitation on FOTO    Baseline  45% limited down from 51%    Time  6    Period  Weeks    Status  Partially Met    Target Date  08/17/19            Plan - 07/11/19 1534    Clinical Impression Statement  Pt is still feeling better overall even though she has not been on medicine for anti-inflammation.  Pt was able to progress resistance a little  today with no pain.  PT did less dry needling as part of treatment and only focused on Rt upper trap.  Overall patient is making slow and steady progress with good response from myofascial release today.  She will benefit from skilled PT to ensure successful transition to ind with HEP and no increased pain with functinal activities.    PT Treatment/Interventions  ADLs/Self Care Home Management;Cryotherapy;Electrical Stimulation;Moist Heat;Traction;Therapeutic activities;Therapeutic exercise;Neuromuscular re-education;Manual techniques;Dry needling;Passive range of motion;Patient/family education;Taping    PT Next Visit Plan  progress band and posture exercises standing and sitting; serratus punches, continue manual and dry needling to rhomboids, levator and thoracic multifidi as needed    PT Home Exercise Plan  Access Code: [email or web address]    Recommended Other Services  MD signed re-cert    Consulted and Agree with Plan of Care  Patient       Patient will benefit from skilled therapeutic intervention in order to improve the following deficits and impairments:  Pain, Increased fascial restricitons, Increased muscle spasms, Impaired tone, Postural dysfunction, Decreased strength, Decreased range of motion  Visit Diagnosis: Cervicalgia  Abnormal posture  Muscle weakness (generalized)     Problem List There are no active problems to display for this patient.   Cheryl Schmitt, PT 07/11/2019, 3:37 PM  [facility] [address], [place], [place], [address] Phone: [phone]   Fax:  [phone]  Name: Cheryl Schmitt MRN: [id number] Date of Birth: 12/30/1950

## 2019-07-11 NOTE — Telephone Encounter (Signed)
Patient did not show for appointment.  Patient was called and PT left message to please call us back.  Gustavus Bryant, PT 07/11/19 10:39 AM

## 2019-07-18 ENCOUNTER — Other Ambulatory Visit: Payer: Self-pay

## 2019-07-18 ENCOUNTER — Ambulatory Visit: Payer: Medicare Other | Attending: Family Medicine | Admitting: Physical Therapy

## 2019-07-18 ENCOUNTER — Encounter: Payer: Self-pay | Admitting: Physical Therapy

## 2019-07-18 DIAGNOSIS — M6281 Muscle weakness (generalized): Secondary | ICD-10-CM | POA: Insufficient documentation

## 2019-07-18 DIAGNOSIS — M542 Cervicalgia: Secondary | ICD-10-CM | POA: Insufficient documentation

## 2019-07-18 DIAGNOSIS — R293 Abnormal posture: Secondary | ICD-10-CM | POA: Diagnosis present

## 2019-07-18 NOTE — Therapy (Signed)
[facility] [address], [place], [place], [address] Phone: [phone]   Fax:  [phone]  Physical Therapy Treatment  Patient Details  Name: Cheryl Schmitt MRN: [id number] Date of Birth: 11/21/1950 Referring Provider (PT): Eunice Blase, MD   Encounter Date: 07/18/2019  PT End of Session - 07/18/19 1412    Visit Number  15    Date for PT Re-Evaluation  08/17/19    Authorization Type  [employer] medicare    PT Start Time  0300    PT Stop Time  9233    PT Time Calculation (min)  40 min    Activity Tolerance  Patient tolerated treatment well;No increased pain    Behavior During Therapy  WFL for tasks assessed/performed       Past Medical History:  Diagnosis Date  . Depression     History reviewed. No pertinent surgical history.  There were no vitals filed for this visit.  Subjective Assessment - 07/18/19 1415    Subjective  I am a little stiff but it is feeling better.  I have been doing the exercises and warm showers in the morning loosens it.    Currently in Pain?  No/denies                       [facility] Adult PT Treatment/Exercise - 07/18/19 0001      Shoulder Exercises: Standing   Horizontal ABduction  Strengthening;Both;15 reps;Theraband    Theraband Level (Shoulder Horizontal ABduction)  Level 1 (Yellow)    External Rotation  Strengthening;Both;20 reps;Theraband    Theraband Level (Shoulder External Rotation)  Level 1 (Yellow)    Extension  Strengthening;Both;20 reps;Weights   15#   Row  Strengthening;Both;20 reps;Weights   20#   Other Standing Exercises  foam roller at the wall - yellow band - 20 x      Shoulder Exercises: ROM/Strengthening   Nustep  L3 x 5 min   PT present for update     Manual Therapy   Soft tissue mobilization  periscapular muslce, teres minor and infraspinatus, upper trap, cervical paraspinlas, suboccipitals    Myofascial Release  suboccipital release        Trigger Point Dry Needling - 07/18/19 0001    Consent Given?  Yes    Education Handout Provided  Previously provided    Upper Trapezius Response  Twitch reponse elicited;Palpable increased muscle length             PT Short Term Goals - 07/07/19 1300      PT SHORT TERM GOAL #1   Title  ind with initial HEP    Status  Achieved      PT SHORT TERM GOAL #2   Title  cervical ROM improved to at least 45 deg bilat    Baseline  50 deg Rt; 40 deg to Lt    Status  Partially Met      PT SHORT TERM GOAL #3   Title  Pt will have no radicular symptoms down her arm due to reduced muscle spasms    Baseline  do not notice that anymore    Status  Achieved        PT Long Term Goals - 07/06/19 1618      PT LONG TERM GOAL #1   Title  Pt will be able to take a bath due to increased UE strength    Time  6    Period  Weeks  Status  New    Target Date  08/17/19      PT LONG TERM GOAL #2   Title  Pt will demonstrate at least 50 deg of cervical rotation bilat for improved ability to look behind when driving    Baseline  still having problems looking behind me; 50 deg Rt; 40 deg to Lt    Time  6    Period  Weeks    Status  On-going    Target Date  08/17/19      PT LONG TERM GOAL #3   Title  Pt will report at least 60% less pain when cleaning her home    Baseline  90% improved    Time  6    Period  Weeks    Status  Achieved    Target Date  08/17/19      PT LONG TERM GOAL #4   Title  Pt will report < or = 40% limitation on FOTO    Baseline  45% limited down from 51%    Time  6    Period  Weeks    Status  Partially Met    Target Date  08/17/19            Plan - 07/18/19 1539    Clinical Impression Statement  Pt is doing well and has no pain without being on anti-inflammatories.  Pt was able to progress to some standing band exercises with mild cues for posture.  Pt has less tenderness and trigger points palpated throughout the neck.  Pt will benefit from skilled PT  to continue to work on improved postural strength for maximum functional outcomes.    PT Treatment/Interventions  ADLs/Self Care Home Management;Cryotherapy;Electrical Stimulation;Moist Heat;Traction;Therapeutic activities;Therapeutic exercise;Neuromuscular re-education;Manual techniques;Dry needling;Passive range of motion;Patient/family education;Taping    PT Next Visit Plan  progress band and posture exercises standing and sitting; serratus punches, continue manual and dry needling to rhomboids, levator and thoracic multifidi as needed    PT Home Exercise Plan  Access Code: [email or web address]    Consulted and Agree with Plan of Care  Patient       Patient will benefit from skilled therapeutic intervention in order to improve the following deficits and impairments:  Pain, Increased fascial restricitons, Increased muscle spasms, Impaired tone, Postural dysfunction, Decreased strength, Decreased range of motion  Visit Diagnosis: Cervicalgia  Abnormal posture  Muscle weakness (generalized)     Problem List There are no active problems to display for this patient.   Jule Ser, PT 07/18/2019, 3:45 PM  [facility] [address], [place], [place], [address] Phone: [phone]   Fax:  [phone]  Name: Cheryl Schmitt MRN: [id number] Date of Birth: [birth year]

## 2019-07-26 ENCOUNTER — Encounter: Payer: Self-pay | Admitting: Physical Therapy

## 2019-07-26 ENCOUNTER — Ambulatory Visit: Payer: Medicare Other | Admitting: Physical Therapy

## 2019-07-26 ENCOUNTER — Other Ambulatory Visit: Payer: Self-pay

## 2019-07-26 ENCOUNTER — Encounter: Payer: Medicare Other | Admitting: Physical Therapy

## 2019-07-26 DIAGNOSIS — R293 Abnormal posture: Secondary | ICD-10-CM

## 2019-07-26 DIAGNOSIS — M542 Cervicalgia: Secondary | ICD-10-CM | POA: Diagnosis not present

## 2019-07-26 DIAGNOSIS — M6281 Muscle weakness (generalized): Secondary | ICD-10-CM

## 2019-07-26 NOTE — Therapy (Signed)
[facility] [address], [place], [place], [address] Phone: [phone]   Fax:  [phone]  Physical Therapy Treatment  Patient Details  Name: Antoine Fiallos MRN: [id number] Date of Birth: [birth year] Referring Provider (PT): Eunice Blase, MD   Encounter Date: 07/26/2019  PT End of Session - 07/26/19 1405    Visit Number  16    Date for PT Re-Evaluation  08/17/19    Authorization Type  [employer] medicare    PT Start Time  8592    PT Stop Time  9244    PT Time Calculation (min)  40 min    Activity Tolerance  Patient tolerated treatment well;No increased pain    Behavior During Therapy  WFL for tasks assessed/performed       Past Medical History:  Diagnosis Date  . Depression     History reviewed. No pertinent surgical history.  There were no vitals filed for this visit.  Subjective Assessment - 07/26/19 1409    Subjective  I am okay, stiff but not painful.  [place]    Currently in Pain?  No/denies                       [facility] Adult PT Treatment/Exercise - 07/26/19 0001      Shoulder Exercises: Standing   Horizontal ABduction  Strengthening;Both;Theraband;20 reps    Theraband Level (Shoulder Horizontal ABduction)  Level 1 (Yellow)    External Rotation  Strengthening;Both;20 reps;Theraband    Theraband Level (Shoulder External Rotation)  Level 1 (Yellow)    Extension  Strengthening;Both;Weights;15 reps   20# added 2 sets   Row  Strengthening;Both;Weights;15 reps   25# added 2 sets   Diagonals  Strengthening;Both;20 reps;Theraband    Theraband Level (Shoulder Diagonals)  Level 1 (Yellow)   foam roll at wall   Other Standing Exercises  tricip pull down 20# 2 x 15     Other Standing Exercises  wall push up with ball - 15x      Shoulder Exercises: ROM/Strengthening   UBE (Upper Arm Bike)  L1 x 4 min foward   PT present to cue posture and status update   Other  ROM/Strengthening Exercises  circles at the wall - 30x each way    Other ROM/Strengthening Exercises  thoracic flexion/extension stretch - 10x 5 sec hold      Manual Therapy   Soft tissue mobilization  upper trap, cervical paraspinlas, suboccipitals, scalenes, SCM    Myofascial Release  suboccipital release               PT Short Term Goals - 07/07/19 1300      PT SHORT TERM GOAL #1   Title  ind with initial HEP    Status  Achieved      PT SHORT TERM GOAL #2   Title  cervical ROM improved to at least 45 deg bilat    Baseline  50 deg Rt; 40 deg to Lt    Status  Partially Met      PT SHORT TERM GOAL #3   Title  Pt will have no radicular symptoms down her arm due to reduced muscle spasms    Baseline  do not notice that anymore    Status  Achieved        PT Long Term Goals - 07/26/19 1411      PT LONG TERM GOAL #1   Title  Pt  will be able to take a bath due to increased UE strength    Baseline  still difficult but more because my arms feel weak not pain    Status  On-going            Plan - 07/26/19 1447    Clinical Impression Statement  Pt was able to increase resistance and duration of exercises today.  Pt still needs cues to work on posture so she can acitvate scapular stabilizers more easily.  Pt has mild winging when doing push ups that increased with fatigue.  Pt will benefit from skilled Pt to improve strength and posture.    PT Treatment/Interventions  ADLs/Self Care Home Management;Cryotherapy;Electrical Stimulation;Moist Heat;Traction;Therapeutic activities;Therapeutic exercise;Neuromuscular re-education;Manual techniques;Dry needling;Passive range of motion;Patient/family education;Taping    PT Next Visit Plan  progress band and posture exercises standing and sitting; serratus punches, continue manual and dry needling to rhomboids, levator and thoracic multifidi as needed    PT Home Exercise Plan  Access Code: [email or web address]    Consulted and Agree with Plan of  Care  Patient       Patient will benefit from skilled therapeutic intervention in order to improve the following deficits and impairments:  Pain, Increased fascial restricitons, Increased muscle spasms, Impaired tone, Postural dysfunction, Decreased strength, Decreased range of motion  Visit Diagnosis: Cervicalgia  Abnormal posture  Muscle weakness (generalized)     Problem List There are no active problems to display for this patient.   Camillo Flaming Susannah Carbin, PT 07/26/2019, 2:52 PM  [facility] [address] [place], [place], [address] Phone: [phone]   Fax:  [phone]  Name: Aspynn Clover MRN: [id number] Date of Birth: July 29, 1951

## 2019-08-01 ENCOUNTER — Other Ambulatory Visit: Payer: Self-pay

## 2019-08-01 ENCOUNTER — Ambulatory Visit: Payer: Medicare Other | Admitting: Physical Therapy

## 2019-08-01 DIAGNOSIS — R293 Abnormal posture: Secondary | ICD-10-CM

## 2019-08-01 DIAGNOSIS — M542 Cervicalgia: Secondary | ICD-10-CM | POA: Diagnosis not present

## 2019-08-01 DIAGNOSIS — M6281 Muscle weakness (generalized): Secondary | ICD-10-CM

## 2019-08-01 NOTE — Therapy (Signed)
[facility] [address], [place], [place], [address] Phone: [phone]   Fax:  [phone]  Physical Therapy Treatment  Patient Details  Name: Cheryl Schmitt MRN: [id number] Date of Birth: [birth year] Referring Provider (PT): Eunice Blase, MD   Encounter Date: 08/01/2019  PT End of Session - 08/01/19 1405    Visit Number  17    Date for PT Re-Evaluation  08/17/19    Authorization Type  [employer] medicare    PT Start Time  1401    PT Stop Time  8206    PT Time Calculation (min)  42 min    Activity Tolerance  Patient tolerated treatment well;No increased pain    Behavior During Therapy  WFL for tasks assessed/performed       Past Medical History:  Diagnosis Date  . Depression     No past surgical history on file.  There were no vitals filed for this visit.  Subjective Assessment - 08/01/19 1408    Subjective  Pt states she is stiff like usual.  Pt states she has been doing the exercises and found 3-4lb weights.    Currently in Pain?  No/denies                       [facility] Adult PT Treatment/Exercise - 08/01/19 0001      Neck Exercises: Supine   Shoulder ABduction  Both;20 reps    Shoulder Abduction Limitations  horizontal abductin - red band      Shoulder Exercises: Standing   External Rotation  Strengthening;Both;15 reps;Weights    External Rotation Weight (lbs)  --   1 plate   Flexion  [id number];MDYJ;09 reps;Weights   fwd punch 15#   Extension  Strengthening;Both;Weights;15 reps   20# added 2 sets   Row  Strengthening;Both;Weights;15 reps   25# added 2 sets   Diagonals  Strengthening;Both;20 reps;Weights    Diagonals Weight (lbs)  15#    Other Standing Exercises  tricip pull down 20# 2 x 15     Other Standing Exercises  wall push up with ball - 15x      Shoulder Exercises: ROM/Strengthening   Nustep  L3 x 6 min    Other ROM/Strengthening Exercises  thoracic flexion/extension  stretch - 10x 5 sec hold      Manual Therapy   Soft tissue mobilization  upper trap, cervical paraspinlas, suboccipitals, scalenes, SCM    Myofascial Release  suboccipital release               PT Short Term Goals - 07/07/19 1300      PT SHORT TERM GOAL #1   Title  ind with initial HEP    Status  Achieved      PT SHORT TERM GOAL #2   Title  cervical ROM improved to at least 45 deg bilat    Baseline  50 deg Rt; 40 deg to Lt    Status  Partially Met      PT SHORT TERM GOAL #3   Title  Pt will have no radicular symptoms down her arm due to reduced muscle spasms    Baseline  do not notice that anymore    Status  Achieved        PT Long Term Goals - 08/01/19 1458      PT LONG TERM GOAL #1   Title  Pt will be able to take a bath due to increased UE strength  Status  On-going      PT LONG TERM GOAL #2   Title  Pt will demonstrate at least 50 deg of cervical rotation bilat for improved ability to look behind when driving    Status  On-going      PT LONG TERM GOAL #3   Title  Pt will report at least 60% less pain when cleaning her home    Status  Achieved      PT LONG TERM GOAL #4   Title  Pt will report < or = 40% limitation on FOTO    Status  Partially Met            Plan - 08/01/19 1448    Clinical Impression Statement  Pt was able to increase difficulty with some exercises today.  She had no pain before and after today's treatment.  She felt like turning was easier after STM and myofascial release.  She needed mild cueing to keep shoulders in correct position during exercises.  Pt will continue to benefit from skilled PT to work on improved postural strength and ROM.    PT Treatment/Interventions  ADLs/Self Care Home Management;Cryotherapy;Electrical Stimulation;Moist Heat;Traction;Therapeutic activities;Therapeutic exercise;Neuromuscular re-education;Manual techniques;Dry needling;Passive range of motion;Patient/family education;Taping    PT Next Visit  Plan  progress band and posture exercises standing and sitting; serratus punches, continue manual and dry needling to rhomboids, levator and thoracic multifidi as needed    PT Home Exercise Plan  Access Code: [email or web address]    Consulted and Agree with Plan of Care  Patient       Patient will benefit from skilled therapeutic intervention in order to improve the following deficits and impairments:  Pain, Increased fascial restricitons, Increased muscle spasms, Impaired tone, Postural dysfunction, Decreased strength, Decreased range of motion  Visit Diagnosis: Cervicalgia  Abnormal posture  Muscle weakness (generalized)     Problem List There are no active problems to display for this patient.   Camillo Flaming Tyquon Near, PT 08/01/2019, 2:58 PM  [facility] [address] [place], [place], [address] Phone: [phone]   Fax:  [phone]  Name: Cheryl Schmitt MRN: [id number] Date of Birth: Oct 16, 1951

## 2019-08-08 ENCOUNTER — Other Ambulatory Visit: Payer: Self-pay

## 2019-08-08 ENCOUNTER — Ambulatory Visit: Payer: Medicare Other | Admitting: Physical Therapy

## 2019-08-08 DIAGNOSIS — M542 Cervicalgia: Secondary | ICD-10-CM | POA: Diagnosis not present

## 2019-08-08 DIAGNOSIS — M6281 Muscle weakness (generalized): Secondary | ICD-10-CM

## 2019-08-08 DIAGNOSIS — R293 Abnormal posture: Secondary | ICD-10-CM

## 2019-08-08 NOTE — Therapy (Signed)
[facility] [address] [place], [place], [address] Phone: [phone]   Fax:  [phone]  Physical Therapy Treatment  Patient Details  Name: Cheryl Schmitt MRN: [id number] Date of Birth: 03-Jan-1951 Referring Provider (PT): Eunice Blase, MD   Encounter Date: 08/08/2019  PT End of Session - 08/08/19 1408    Visit Number  18    Date for PT Re-Evaluation  08/17/19    Authorization Type  [employer] medicare    PT Start Time  0272   pt arrived late   PT Stop Time  1446    PT Time Calculation (min)  38 min    Activity Tolerance  Patient tolerated treatment well;No increased pain    Behavior During Therapy  WFL for tasks assessed/performed       Past Medical History:  Diagnosis Date  . Depression     No past surgical history on file.  There were no vitals filed for this visit.  Subjective Assessment - 08/08/19 1415    Subjective  Pt states she has been using the weights some, mostly 1lb.    Patient Stated Goals  Take away the pain    Currently in Pain?  No/denies         [facility] PT Assessment - 08/08/19 0001      AROM   Cervical - Right Rotation  60    Cervical - Left Rotation  45                   [facility] Adult PT Treatment/Exercise - 08/08/19 0001      Lumbar Exercises: Aerobic   Nustep  L3 x 6 min - emphasis on UE and keep core engaged   PT present for status update     Shoulder Exercises: Standing   Horizontal ABduction  Strengthening;Both;Theraband;20 reps    Theraband Level (Shoulder Horizontal ABduction)  Level 1 (Yellow)    External Rotation  Strengthening;Both;15 reps;Weights    External Rotation Weight (lbs)  --   1 plate   Flexion  [id number];ZDGL;87 reps;Weights   fwd punch 15#   Extension  Strengthening;Both;Weights;15 reps   20# added 2 sets   Row  Strengthening;Both;Weights;15 reps   25# added 2 sets   Diagonals  Strengthening;Both;20 reps;Weights    Diagonals Weight  (lbs)  15#    Other Standing Exercises  tricip pull down 20# 2 x 15     Other Standing Exercises  wall push up with ball - 15x      Manual Therapy   Soft tissue mobilization  upper trap, cervical paraspinlas, suboccipitals, scalenes, SCM    Myofascial Release  suboccipital release               PT Short Term Goals - 07/07/19 1300      PT SHORT TERM GOAL #1   Title  ind with initial HEP    Status  Achieved      PT SHORT TERM GOAL #2   Title  cervical ROM improved to at least 45 deg bilat    Baseline  50 deg Rt; 40 deg to Lt    Status  Partially Met      PT SHORT TERM GOAL #3   Title  Pt will have no radicular symptoms down her arm due to reduced muscle spasms    Baseline  do not notice that anymore    Status  Achieved        PT Long Term Goals -  08/01/19 1458      PT LONG TERM GOAL #1   Title  Pt will be able to take a bath due to increased UE strength    Status  On-going      PT LONG TERM GOAL #2   Title  Pt will demonstrate at least 50 deg of cervical rotation bilat for improved ability to look behind when driving    Status  On-going      PT LONG TERM GOAL #3   Title  Pt will report at least 60% less pain when cleaning her home    Status  Achieved      PT LONG TERM GOAL #4   Title  Pt will report < or = 40% limitation on FOTO    Status  Partially Met            Plan - 08/08/19 1512    Clinical Impression Statement  Pt demonstrates slightly more cervical ROM in both directions today.  She continues to make slow and steady progress with strengthening. She will benefit from skilled PT to continue with POC and re-assess POC next visit.    PT Treatment/Interventions  ADLs/Self Care Home Management;Cryotherapy;Electrical Stimulation;Moist Heat;Traction;Therapeutic activities;Therapeutic exercise;Neuromuscular re-education;Manual techniques;Dry needling;Passive range of motion;Patient/family education;Taping    PT Next Visit Plan  re-assess discharge likely  next    PT Home Exercise Plan  Access Code: [email or web address]    Consulted and Agree with Plan of Care  Patient       Patient will benefit from skilled therapeutic intervention in order to improve the following deficits and impairments:  Pain, Increased fascial restricitons, Increased muscle spasms, Impaired tone, Postural dysfunction, Decreased strength, Decreased range of motion  Visit Diagnosis: Cervicalgia  Abnormal posture  Muscle weakness (generalized)     Problem List There are no active problems to display for this patient.   Jule Ser, PT 08/08/2019, 3:17 PM  [facility] [address] [place], [place], [address] Phone: [phone]   Fax:  [phone]  Name: Cheryl Schmitt MRN: [id number] Date of Birth: [birth year]

## 2019-08-15 ENCOUNTER — Ambulatory Visit: Payer: Medicare Other | Admitting: Physical Therapy

## 2019-08-17 ENCOUNTER — Other Ambulatory Visit: Payer: Self-pay

## 2019-08-17 ENCOUNTER — Ambulatory Visit: Payer: Medicare Other | Attending: Family Medicine | Admitting: Physical Therapy

## 2019-08-17 ENCOUNTER — Encounter: Payer: Self-pay | Admitting: Physical Therapy

## 2019-08-17 DIAGNOSIS — R293 Abnormal posture: Secondary | ICD-10-CM | POA: Diagnosis present

## 2019-08-17 DIAGNOSIS — M6281 Muscle weakness (generalized): Secondary | ICD-10-CM

## 2019-08-17 DIAGNOSIS — M542 Cervicalgia: Secondary | ICD-10-CM

## 2019-08-17 NOTE — Therapy (Signed)
[facility] [address], [place], [place], [address] Phone: [phone]   Fax:  [phone]  Physical Therapy Treatment  Patient Details  Name: Cheryl Schmitt MRN: [id number] Date of Birth: 09/01/1951 Referring Provider (PT): Eunice Blase, MD   Encounter Date: 08/17/2019  PT End of Session - 08/17/19 0904    Visit Number  19    Date for PT Re-Evaluation  08/17/19    Authorization Type  [employer] medicare    PT Start Time  0850    PT Stop Time  0933    PT Time Calculation (min)  43 min    Activity Tolerance  Patient tolerated treatment well;No increased pain    Behavior During Therapy  WFL for tasks assessed/performed       Past Medical History:  Diagnosis Date  . Depression     History reviewed. No pertinent surgical history.  There were no vitals filed for this visit.  Subjective Assessment - 08/17/19 0905    Subjective  I feel much better than I did 6 weeks ago so I am glad we got some more time to work.    Patient Stated Goals  Take away the pain    Currently in Pain?  No/denies                       [facility] Adult PT Treatment/Exercise - 08/17/19 0001      Shoulder Exercises: Standing   Horizontal ABduction  Strengthening;Both;Theraband;20 reps    Theraband Level (Shoulder Horizontal ABduction)  Level 1 (Yellow)    External Rotation  Strengthening;Both;15 reps;Weights    External Rotation Weight (lbs)  --   1 plate   Flexion Limitations  flexion in sitting with 1lb hand weights    Extension  Strengthening;Both;Weights;15 reps   20# added 2 sets   Row  Strengthening;Both;Weights;15 reps   25# added 2 sets   Diagonals  Strengthening;Both;20 reps;Weights    Diagonals Weight (lbs)  15#    Other Standing Exercises  tricip pull down 20# 2 x 15       Shoulder Exercises: ROM/Strengthening   UBE (Upper Arm Bike)  L1 x 4 min foward   PT present to cue posture and status update     Manual  Therapy   Soft tissue mobilization  upper trap, cervical paraspinlas, suboccipitals, scalenes, SCM    Myofascial Release  suboccipital release               PT Short Term Goals - 07/07/19 1300      PT SHORT TERM GOAL #1   Title  ind with initial HEP    Status  Achieved      PT SHORT TERM GOAL #2   Title  cervical ROM improved to at least 45 deg bilat    Baseline  50 deg Rt; 40 deg to Lt    Status  Partially Met      PT SHORT TERM GOAL #3   Title  Pt will have no radicular symptoms down her arm due to reduced muscle spasms    Baseline  do not notice that anymore    Status  Achieved        PT Long Term Goals - 08/17/19 1018      PT LONG TERM GOAL #1   Title  Pt will be able to take a bath due to increased UE strength    Baseline  haven't been trying  Status  Not Met      PT LONG TERM GOAL #2   Title  Pt will demonstrate at least 50 deg of cervical rotation bilat for improved ability to look behind when driving    Baseline  50 deg Rt; 40 deg to Lt    Status  Partially Met      PT LONG TERM GOAL #3   Title  Pt will report at least 60% less pain when cleaning her home    Baseline  90% improved    Status  Achieved      PT LONG TERM GOAL #4   Title  Pt will report < or = 40% limitation on FOTO    Status  Partially Met            Plan - 08/17/19 1016    Clinical Impression Statement  Pt has met most goals and she is ind with HEP at this time.  She continues to have some slight residual stifness but she is aware of how to self massage    PT Treatment/Interventions  ADLs/Self Care Home Management;Cryotherapy;Electrical Stimulation;Moist Heat;Traction;Therapeutic activities;Therapeutic exercise;Neuromuscular re-education;Manual techniques;Dry needling;Passive range of motion;Patient/family education;Taping       Patient will benefit from skilled therapeutic intervention in order to improve the following deficits and impairments:  Pain, Increased fascial  restricitons, Increased muscle spasms, Impaired tone, Postural dysfunction, Decreased strength, Decreased range of motion  Visit Diagnosis: Cervicalgia  Abnormal posture  Muscle weakness (generalized)     Problem List There are no active problems to display for this patient.   Camillo Flaming Jonny Dearden, PT 08/17/2019, 10:19 AM  [facility] [address] [place], [place], [address] Phone: [phone]   Fax:  [phone]  Name: Cheryl Schmitt MRN: [id number] Date of Birth: September 03, 1951  PHYSICAL THERAPY DISCHARGE SUMMARY  Visits from Start of Care: 19  Current functional level related to goals / functional outcomes: See above goals   Remaining deficits: See above   Education / Equipment: HEP  Plan: Patient agrees to discharge.  Patient goals were not met. Patient is being discharged due to lack of progress.  ?????    [employer], PT 08/17/19 10:20 AM

## 2019-10-02 IMAGING — MR MRI CERVICAL SPINE WITHOUT CONTRAST
4 of 5 series · 30 of 48 positions shown · non-contrast
Comparison: None.

CLINICAL DATA: Chronic neck pain

EXAM:
MRI CERVICAL SPINE WITHOUT CONTRAST
TECHNIQUE: Multiplanar, multisequence MR imaging of the cervical spine was
performed. No intravenous contrast was administered.

[Series 5: T2 · sagittal · 3.0mm · 0.66mm/px · 8 of 18 slices shown (1 of 2)]
[im 1/18]
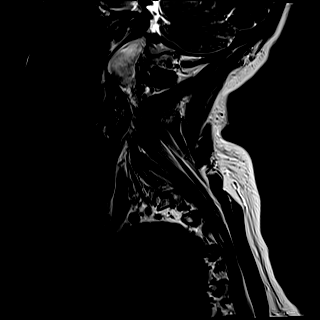
[im 3/18]
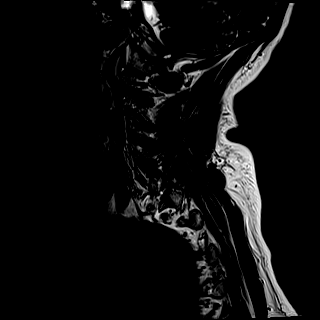
[im 5/18]
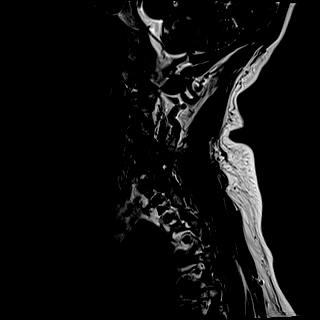
[im 8/18]
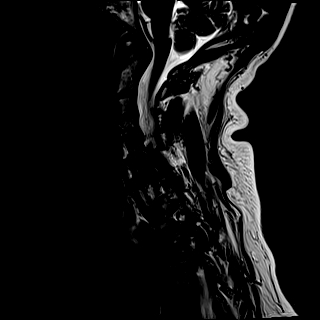
[im 10/18]
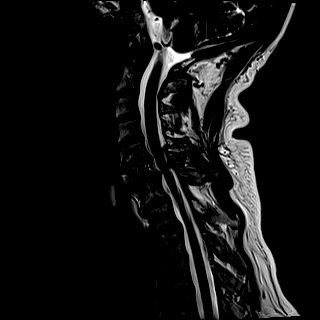
[im 13/18]
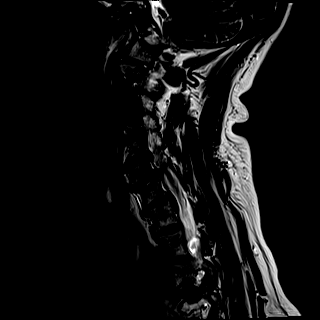
[im 15/18]
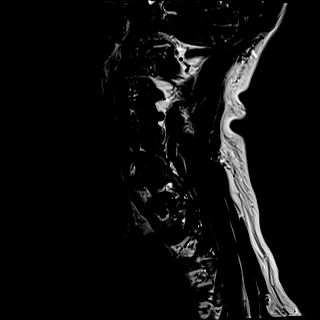
[im 18/18]
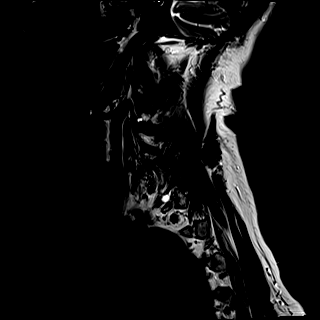

[Series 6: T1 · sagittal · 3.0mm · 0.66mm/px · 8 of 18 slices shown]
[im 1/18]
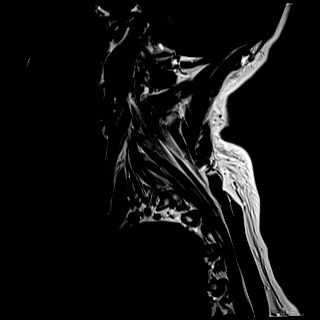
[im 3/18]
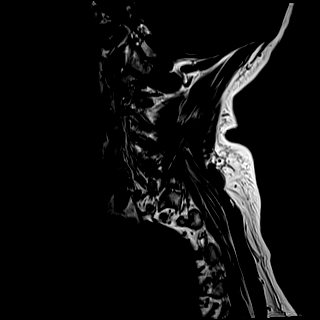
[im 5/18]
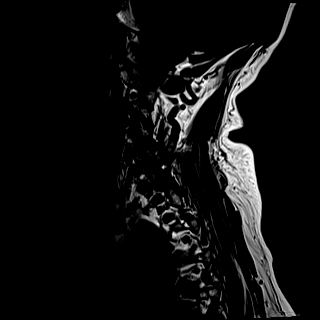
[im 8/18]
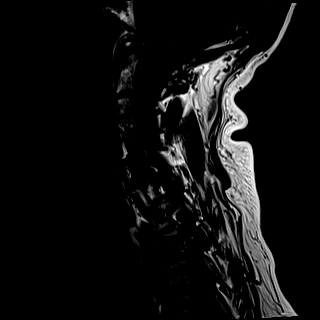
[im 10/18]
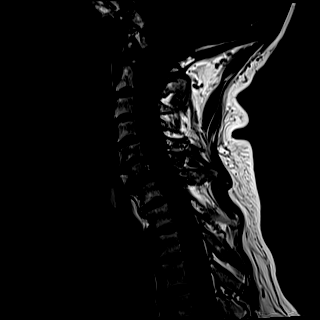
[im 13/18]
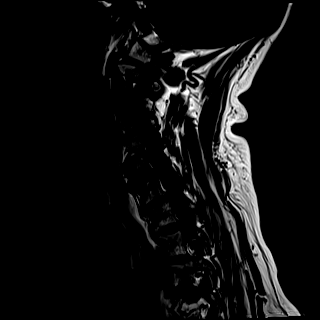
[im 15/18]
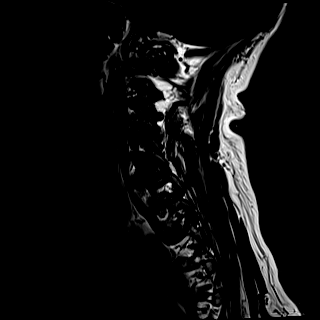
[im 18/18]
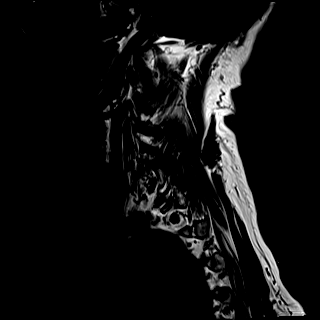

[Series 7: STIR · sagittal · 3.0mm · 0.33mm/px · 5 of 18 slices shown]
[im 1/18]
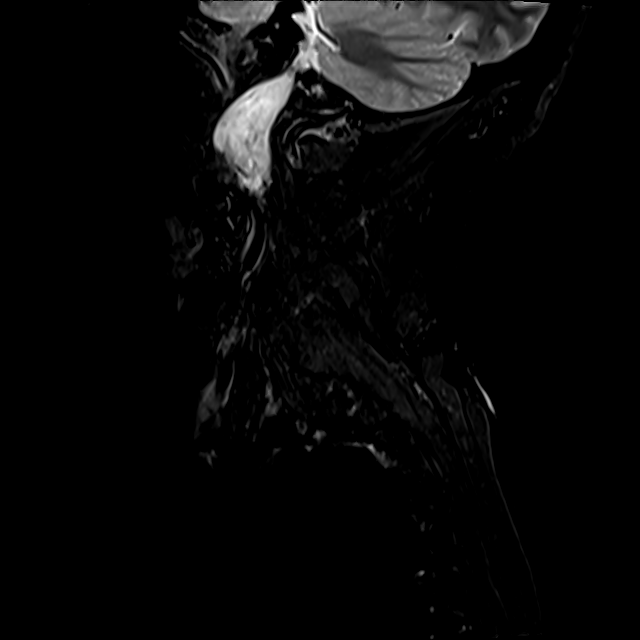
[im 3/18]
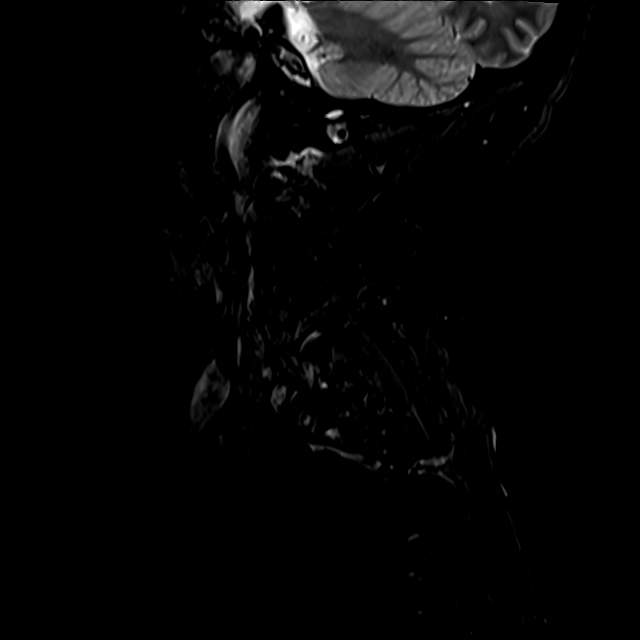
[im 5/18]
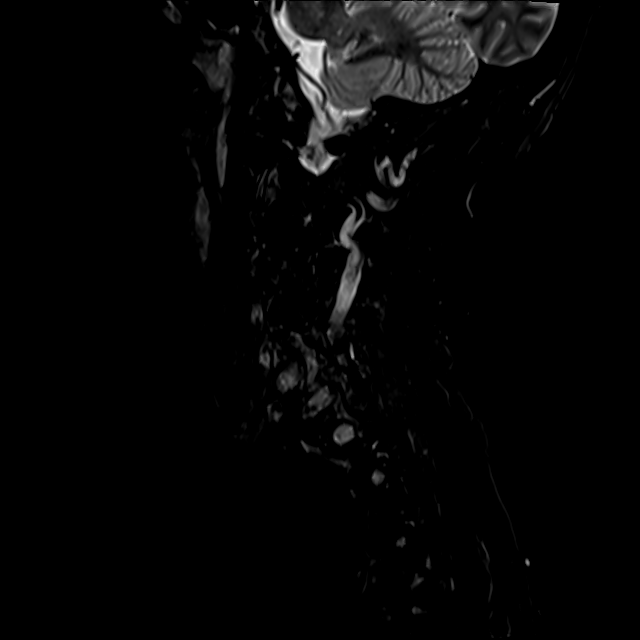
[im 10/18]
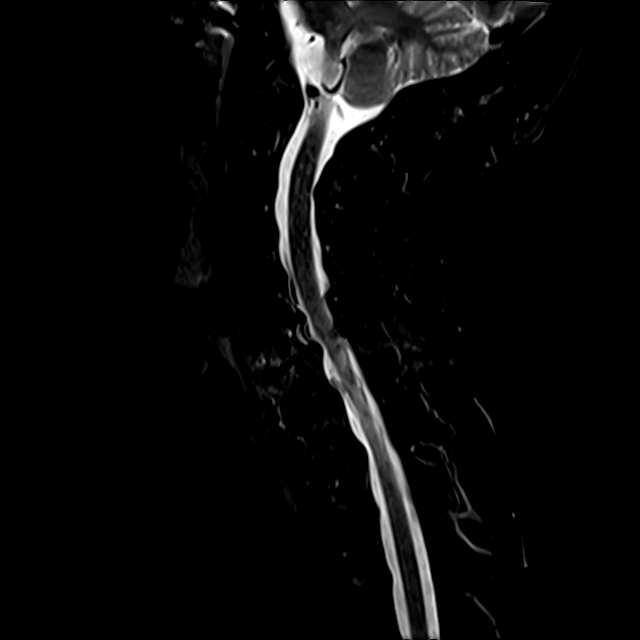
[im 15/18]
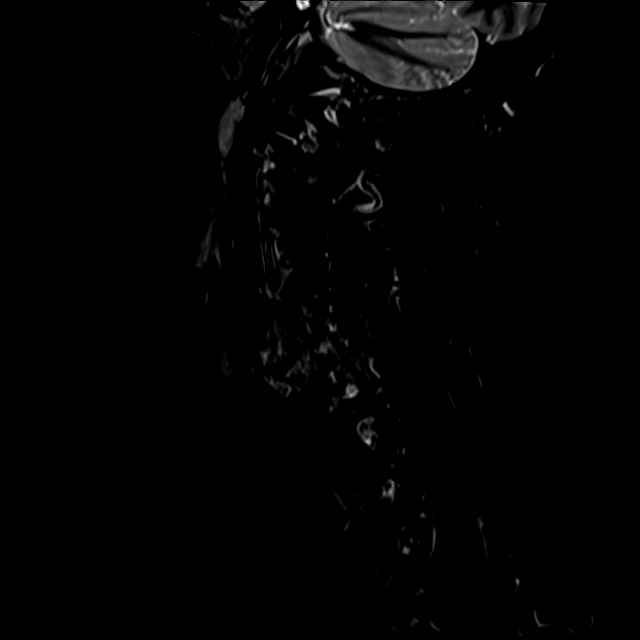

[Series 8: T2 · axial · 3.0mm · 0.50mm/px · z∈[-36,+49]mm · 9 of 29 slices shown (2 of 2)]
[im 1/29]
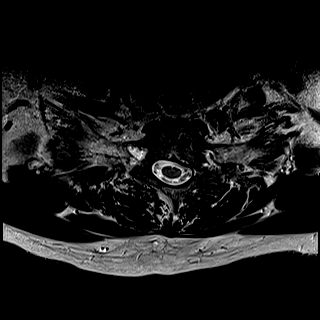
[im 6/29]
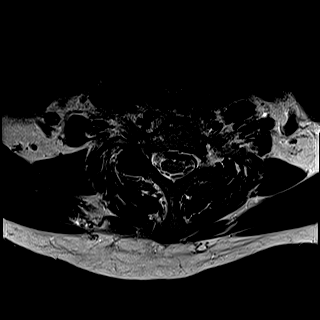
[im 8/29]
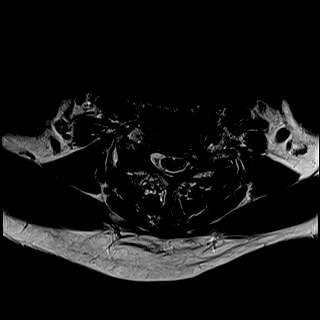
[im 13/29]
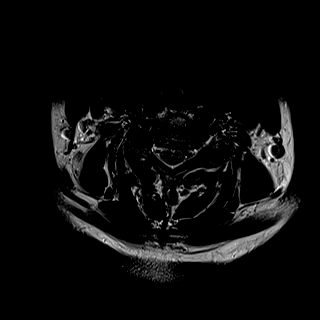
[im 16/29]
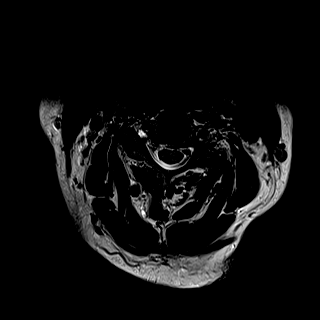
[im 21/29]
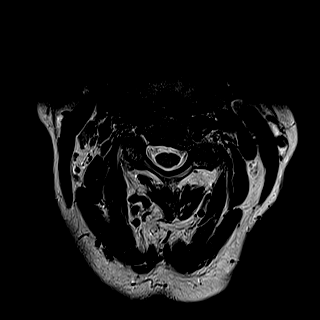
[im 23/29]
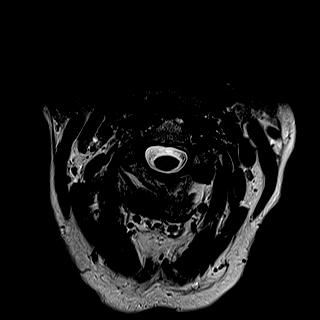
[im 26/29]
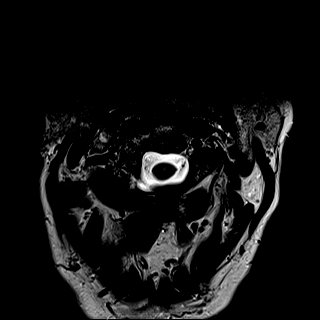
[im 29/29]
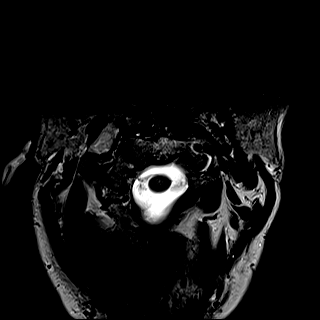

[30 of 48 positions shown; findings below may reference images not displayed]

FINDINGS: Alignment: There is a grade 1 anterolisthesis of C6 on C7 measuring
3 mm and a minimal anterolisthesis of C7 on T1.

Vertebrae: The vertebral body heights are well maintained. End-plate
reactive changes are seen at C7-T1. No fracture, marrow edema,or
pathologic marrow infiltration.

Cord: Normal signal and morphology.

Posterior Fossa, vertebral arteries, paraspinal tissues:

The visualized portion of the posterior fossa is unremarkable.
Normal flow voids seen within the vertebral arteries. The paraspinal
soft tissues are unremarkable.

Disc levels:

C1-C2: Atlantoaxial spurring seen without cord stenosis.

C2-C3: There is a disc osteophyte complex and uncovertebral
osteophytes which causes mild left neural foraminal narrowing.

C3-C4: There is a disc osteophyte complex uncovertebral osteophytes
which causes severe bilateral neural foraminal narrowing.

C4-C5: There is a disc osteophyte complex and uncovertebral
osteophytes which causes severe bilateral neural foraminal
narrowing.

C5-C6: There is a disc osteophyte complex and uncovertebral
osteophytes which causes severe bilateral neural foraminal
narrowing, right greater than left. There is mild effacement of the
anterior thecal sac which measures 7 mm in AP diameter.

C6-C7: There is a disc osteophyte complex which causes mild
bilateral neural foraminal narrowing.

C7-T1: There is a disc osteophyte complex which is eccentric to the
left with uncovertebral osteophytes. There is moderate left neural
foraminal narrowing. There are bilateral perineural cysts seen
within the foramina.
IMPRESSION: 1. Grade 1 anterolisthesis of C6 on C7 and C7 on T1.
2. Cervical spine spondylosis most notable at C5-C6 with severe
bilateral neural foraminal narrowing, right greater the left and
mild canal stenosis.

## 2019-12-18 ENCOUNTER — Ambulatory Visit: Payer: Medicare Other

## 2019-12-22 ENCOUNTER — Encounter: Payer: Self-pay | Admitting: Physical Therapy

## 2019-12-24 ENCOUNTER — Ambulatory Visit: Payer: Medicare PPO | Attending: Internal Medicine

## 2019-12-24 DIAGNOSIS — Z23 Encounter for immunization: Secondary | ICD-10-CM

## 2019-12-24 NOTE — Progress Notes (Signed)
   Covid-19 Vaccination Clinic  Name:  Cheryl Schmitt    MRN: [id number] DOB: 16-Feb-1951  12/24/2019  [name] was observed post Covid-19 immunization for 15 minutes without incidence. She was provided with Vaccine Information Sheet and instruction to access the V-Safe system.   [name] was instructed to call 911 with any severe reactions post vaccine: Marland Kitchen Difficulty breathing  . Swelling of your face and throat  . A fast heartbeat  . A bad rash all over your body  . Dizziness and weakness    Immunizations Administered    Name Date Dose VIS Date Route   Pfizer COVID-19 Vaccine 12/24/2019  9:26 AM 0.3 mL 10/27/2019 Intramuscular   Manufacturer: [employer], [employer]   Lot: [id number]   NDC: [address]-2

## 2020-01-17 ENCOUNTER — Ambulatory Visit: Payer: Medicare PPO | Attending: Internal Medicine

## 2020-01-17 DIAGNOSIS — Z23 Encounter for immunization: Secondary | ICD-10-CM

## 2020-01-17 NOTE — Progress Notes (Signed)
   Covid-19 Vaccination Clinic  Name:  Gracemarie Skeet    MRN: [id number] DOB: June 09, 1951  01/17/2020  Ms. Izard was observed post Covid-19 immunization for 30 minutes based on pre-vaccination screening without incident. She was provided with Vaccine Information Sheet and instruction to access the V-Safe system.   Ms. Petitjean was instructed to call 911 with any severe reactions post vaccine: Marland Kitchen Difficulty breathing  . Swelling of face and throat  . A fast heartbeat  . A bad rash all over body  . Dizziness and weakness   Immunizations Administered    Name Date Dose VIS Date Route   Pfizer COVID-19 Vaccine 01/17/2020  4:12 PM 0.3 mL 10/27/2019 Intramuscular   Manufacturer: [employer], [employer]   Lot: [id number]   NDC: [address]-2

## 2020-11-07 DIAGNOSIS — G4710 Hypersomnia, unspecified: Secondary | ICD-10-CM | POA: Diagnosis not present

## 2020-11-07 DIAGNOSIS — F341 Dysthymic disorder: Secondary | ICD-10-CM | POA: Diagnosis not present

## 2020-11-07 DIAGNOSIS — G4721 Circadian rhythm sleep disorder, delayed sleep phase type: Secondary | ICD-10-CM | POA: Diagnosis not present

## 2020-11-07 DIAGNOSIS — F411 Generalized anxiety disorder: Secondary | ICD-10-CM | POA: Diagnosis not present

## 2020-11-25 DIAGNOSIS — Z01419 Encounter for gynecological examination (general) (routine) without abnormal findings: Secondary | ICD-10-CM | POA: Diagnosis not present

## 2020-11-25 DIAGNOSIS — Z1231 Encounter for screening mammogram for malignant neoplasm of breast: Secondary | ICD-10-CM | POA: Diagnosis not present

## 2020-11-25 DIAGNOSIS — Z124 Encounter for screening for malignant neoplasm of cervix: Secondary | ICD-10-CM | POA: Diagnosis not present

## 2020-11-25 DIAGNOSIS — E7800 Pure hypercholesterolemia, unspecified: Secondary | ICD-10-CM | POA: Diagnosis not present

## 2020-11-25 DIAGNOSIS — Z6829 Body mass index (BMI) 29.0-29.9, adult: Secondary | ICD-10-CM | POA: Diagnosis not present

## 2020-11-25 DIAGNOSIS — R5383 Other fatigue: Secondary | ICD-10-CM | POA: Diagnosis not present

## 2020-11-28 DIAGNOSIS — M25562 Pain in left knee: Secondary | ICD-10-CM | POA: Diagnosis not present

## 2020-11-28 DIAGNOSIS — M1712 Unilateral primary osteoarthritis, left knee: Secondary | ICD-10-CM | POA: Diagnosis not present

## 2021-02-14 DIAGNOSIS — M1712 Unilateral primary osteoarthritis, left knee: Secondary | ICD-10-CM | POA: Diagnosis not present

## 2021-02-14 DIAGNOSIS — R262 Difficulty in walking, not elsewhere classified: Secondary | ICD-10-CM | POA: Diagnosis not present

## 2021-02-14 DIAGNOSIS — M25562 Pain in left knee: Secondary | ICD-10-CM | POA: Diagnosis not present

## 2021-02-17 DIAGNOSIS — M25562 Pain in left knee: Secondary | ICD-10-CM | POA: Diagnosis not present

## 2021-02-17 DIAGNOSIS — M1712 Unilateral primary osteoarthritis, left knee: Secondary | ICD-10-CM | POA: Diagnosis not present

## 2021-02-17 DIAGNOSIS — R262 Difficulty in walking, not elsewhere classified: Secondary | ICD-10-CM | POA: Diagnosis not present

## 2021-03-03 DIAGNOSIS — M1712 Unilateral primary osteoarthritis, left knee: Secondary | ICD-10-CM | POA: Diagnosis not present

## 2021-03-03 DIAGNOSIS — M25562 Pain in left knee: Secondary | ICD-10-CM | POA: Diagnosis not present

## 2021-03-03 DIAGNOSIS — R262 Difficulty in walking, not elsewhere classified: Secondary | ICD-10-CM | POA: Diagnosis not present

## 2021-03-05 DIAGNOSIS — M25562 Pain in left knee: Secondary | ICD-10-CM | POA: Diagnosis not present

## 2021-03-05 DIAGNOSIS — R262 Difficulty in walking, not elsewhere classified: Secondary | ICD-10-CM | POA: Diagnosis not present

## 2021-03-05 DIAGNOSIS — M1712 Unilateral primary osteoarthritis, left knee: Secondary | ICD-10-CM | POA: Diagnosis not present

## 2021-03-10 DIAGNOSIS — R262 Difficulty in walking, not elsewhere classified: Secondary | ICD-10-CM | POA: Diagnosis not present

## 2021-03-10 DIAGNOSIS — M1712 Unilateral primary osteoarthritis, left knee: Secondary | ICD-10-CM | POA: Diagnosis not present

## 2021-03-10 DIAGNOSIS — M25562 Pain in left knee: Secondary | ICD-10-CM | POA: Diagnosis not present

## 2021-03-12 DIAGNOSIS — M1712 Unilateral primary osteoarthritis, left knee: Secondary | ICD-10-CM | POA: Diagnosis not present

## 2021-03-12 DIAGNOSIS — M25562 Pain in left knee: Secondary | ICD-10-CM | POA: Diagnosis not present

## 2021-03-12 DIAGNOSIS — R262 Difficulty in walking, not elsewhere classified: Secondary | ICD-10-CM | POA: Diagnosis not present

## 2021-03-26 DIAGNOSIS — R262 Difficulty in walking, not elsewhere classified: Secondary | ICD-10-CM | POA: Diagnosis not present

## 2021-03-26 DIAGNOSIS — M1712 Unilateral primary osteoarthritis, left knee: Secondary | ICD-10-CM | POA: Diagnosis not present

## 2021-03-26 DIAGNOSIS — M25562 Pain in left knee: Secondary | ICD-10-CM | POA: Diagnosis not present

## 2021-04-02 DIAGNOSIS — M25562 Pain in left knee: Secondary | ICD-10-CM | POA: Diagnosis not present

## 2021-04-02 DIAGNOSIS — M1712 Unilateral primary osteoarthritis, left knee: Secondary | ICD-10-CM | POA: Diagnosis not present

## 2021-04-02 DIAGNOSIS — R262 Difficulty in walking, not elsewhere classified: Secondary | ICD-10-CM | POA: Diagnosis not present

## 2021-04-03 DIAGNOSIS — D225 Melanocytic nevi of trunk: Secondary | ICD-10-CM | POA: Diagnosis not present

## 2021-04-03 DIAGNOSIS — L858 Other specified epidermal thickening: Secondary | ICD-10-CM | POA: Diagnosis not present

## 2021-04-03 DIAGNOSIS — L814 Other melanin hyperpigmentation: Secondary | ICD-10-CM | POA: Diagnosis not present

## 2021-04-03 DIAGNOSIS — L719 Rosacea, unspecified: Secondary | ICD-10-CM | POA: Diagnosis not present

## 2021-04-03 DIAGNOSIS — L821 Other seborrheic keratosis: Secondary | ICD-10-CM | POA: Diagnosis not present

## 2021-04-03 DIAGNOSIS — L578 Other skin changes due to chronic exposure to nonionizing radiation: Secondary | ICD-10-CM | POA: Diagnosis not present

## 2021-04-04 DIAGNOSIS — R262 Difficulty in walking, not elsewhere classified: Secondary | ICD-10-CM | POA: Diagnosis not present

## 2021-04-04 DIAGNOSIS — M1712 Unilateral primary osteoarthritis, left knee: Secondary | ICD-10-CM | POA: Diagnosis not present

## 2021-04-04 DIAGNOSIS — M25562 Pain in left knee: Secondary | ICD-10-CM | POA: Diagnosis not present

## 2021-04-07 DIAGNOSIS — R262 Difficulty in walking, not elsewhere classified: Secondary | ICD-10-CM | POA: Diagnosis not present

## 2021-04-07 DIAGNOSIS — M25562 Pain in left knee: Secondary | ICD-10-CM | POA: Diagnosis not present

## 2021-04-07 DIAGNOSIS — M1712 Unilateral primary osteoarthritis, left knee: Secondary | ICD-10-CM | POA: Diagnosis not present

## 2021-04-18 DIAGNOSIS — M25562 Pain in left knee: Secondary | ICD-10-CM | POA: Diagnosis not present

## 2021-04-18 DIAGNOSIS — M1712 Unilateral primary osteoarthritis, left knee: Secondary | ICD-10-CM | POA: Diagnosis not present

## 2021-04-18 DIAGNOSIS — R262 Difficulty in walking, not elsewhere classified: Secondary | ICD-10-CM | POA: Diagnosis not present

## 2021-04-21 DIAGNOSIS — R262 Difficulty in walking, not elsewhere classified: Secondary | ICD-10-CM | POA: Diagnosis not present

## 2021-04-21 DIAGNOSIS — M25562 Pain in left knee: Secondary | ICD-10-CM | POA: Diagnosis not present

## 2021-04-21 DIAGNOSIS — M1712 Unilateral primary osteoarthritis, left knee: Secondary | ICD-10-CM | POA: Diagnosis not present

## 2021-04-24 DIAGNOSIS — M170 Bilateral primary osteoarthritis of knee: Secondary | ICD-10-CM | POA: Diagnosis not present

## 2021-04-24 DIAGNOSIS — M13862 Other specified arthritis, left knee: Secondary | ICD-10-CM | POA: Diagnosis not present

## 2021-04-24 DIAGNOSIS — M1712 Unilateral primary osteoarthritis, left knee: Secondary | ICD-10-CM | POA: Diagnosis not present

## 2021-04-28 DIAGNOSIS — M1712 Unilateral primary osteoarthritis, left knee: Secondary | ICD-10-CM | POA: Diagnosis not present

## 2021-04-28 DIAGNOSIS — R262 Difficulty in walking, not elsewhere classified: Secondary | ICD-10-CM | POA: Diagnosis not present

## 2021-04-28 DIAGNOSIS — M25562 Pain in left knee: Secondary | ICD-10-CM | POA: Diagnosis not present

## 2021-04-30 DIAGNOSIS — F341 Dysthymic disorder: Secondary | ICD-10-CM | POA: Diagnosis not present

## 2021-04-30 DIAGNOSIS — G4721 Circadian rhythm sleep disorder, delayed sleep phase type: Secondary | ICD-10-CM | POA: Diagnosis not present

## 2021-04-30 DIAGNOSIS — F411 Generalized anxiety disorder: Secondary | ICD-10-CM | POA: Diagnosis not present

## 2021-04-30 DIAGNOSIS — G4710 Hypersomnia, unspecified: Secondary | ICD-10-CM | POA: Diagnosis not present

## 2021-05-09 DIAGNOSIS — M1712 Unilateral primary osteoarthritis, left knee: Secondary | ICD-10-CM | POA: Diagnosis not present

## 2021-05-09 DIAGNOSIS — R262 Difficulty in walking, not elsewhere classified: Secondary | ICD-10-CM | POA: Diagnosis not present

## 2021-05-09 DIAGNOSIS — M25562 Pain in left knee: Secondary | ICD-10-CM | POA: Diagnosis not present

## 2021-06-04 DIAGNOSIS — R262 Difficulty in walking, not elsewhere classified: Secondary | ICD-10-CM | POA: Diagnosis not present

## 2021-06-04 DIAGNOSIS — M1712 Unilateral primary osteoarthritis, left knee: Secondary | ICD-10-CM | POA: Diagnosis not present

## 2021-06-04 DIAGNOSIS — M25562 Pain in left knee: Secondary | ICD-10-CM | POA: Diagnosis not present

## 2021-06-13 DIAGNOSIS — Z961 Presence of intraocular lens: Secondary | ICD-10-CM | POA: Diagnosis not present

## 2021-06-13 DIAGNOSIS — H04123 Dry eye syndrome of bilateral lacrimal glands: Secondary | ICD-10-CM | POA: Diagnosis not present

## 2021-06-13 DIAGNOSIS — H26493 Other secondary cataract, bilateral: Secondary | ICD-10-CM | POA: Diagnosis not present

## 2021-06-13 DIAGNOSIS — H5212 Myopia, left eye: Secondary | ICD-10-CM | POA: Diagnosis not present

## 2021-06-18 DIAGNOSIS — M1712 Unilateral primary osteoarthritis, left knee: Secondary | ICD-10-CM | POA: Diagnosis not present

## 2021-06-18 DIAGNOSIS — R262 Difficulty in walking, not elsewhere classified: Secondary | ICD-10-CM | POA: Diagnosis not present

## 2021-06-18 DIAGNOSIS — M25562 Pain in left knee: Secondary | ICD-10-CM | POA: Diagnosis not present

## 2021-07-16 DIAGNOSIS — M25562 Pain in left knee: Secondary | ICD-10-CM | POA: Diagnosis not present

## 2021-07-16 DIAGNOSIS — M1712 Unilateral primary osteoarthritis, left knee: Secondary | ICD-10-CM | POA: Diagnosis not present

## 2021-07-16 DIAGNOSIS — R262 Difficulty in walking, not elsewhere classified: Secondary | ICD-10-CM | POA: Diagnosis not present

## 2021-07-25 DIAGNOSIS — M1712 Unilateral primary osteoarthritis, left knee: Secondary | ICD-10-CM | POA: Diagnosis not present

## 2021-07-25 DIAGNOSIS — R262 Difficulty in walking, not elsewhere classified: Secondary | ICD-10-CM | POA: Diagnosis not present

## 2021-07-25 DIAGNOSIS — M25562 Pain in left knee: Secondary | ICD-10-CM | POA: Diagnosis not present

## 2021-08-01 DIAGNOSIS — M1712 Unilateral primary osteoarthritis, left knee: Secondary | ICD-10-CM | POA: Diagnosis not present

## 2021-08-01 DIAGNOSIS — R262 Difficulty in walking, not elsewhere classified: Secondary | ICD-10-CM | POA: Diagnosis not present

## 2021-08-01 DIAGNOSIS — M25562 Pain in left knee: Secondary | ICD-10-CM | POA: Diagnosis not present

## 2021-08-20 DIAGNOSIS — M25561 Pain in right knee: Secondary | ICD-10-CM | POA: Diagnosis not present

## 2021-08-20 DIAGNOSIS — M1712 Unilateral primary osteoarthritis, left knee: Secondary | ICD-10-CM | POA: Diagnosis not present

## 2021-08-20 DIAGNOSIS — M545 Low back pain, unspecified: Secondary | ICD-10-CM | POA: Diagnosis not present

## 2021-09-29 DIAGNOSIS — M1712 Unilateral primary osteoarthritis, left knee: Secondary | ICD-10-CM | POA: Diagnosis not present

## 2021-09-29 DIAGNOSIS — M13862 Other specified arthritis, left knee: Secondary | ICD-10-CM | POA: Diagnosis not present

## 2021-11-11 DIAGNOSIS — G4710 Hypersomnia, unspecified: Secondary | ICD-10-CM | POA: Diagnosis not present

## 2021-11-11 DIAGNOSIS — G4721 Circadian rhythm sleep disorder, delayed sleep phase type: Secondary | ICD-10-CM | POA: Diagnosis not present

## 2021-11-11 DIAGNOSIS — F341 Dysthymic disorder: Secondary | ICD-10-CM | POA: Diagnosis not present

## 2021-11-11 DIAGNOSIS — F411 Generalized anxiety disorder: Secondary | ICD-10-CM | POA: Diagnosis not present

## 2021-11-26 ENCOUNTER — Encounter (HOSPITAL_COMMUNITY): Payer: Medicare PPO

## 2021-12-02 DIAGNOSIS — Z1231 Encounter for screening mammogram for malignant neoplasm of breast: Secondary | ICD-10-CM | POA: Diagnosis not present

## 2021-12-05 DIAGNOSIS — F321 Major depressive disorder, single episode, moderate: Secondary | ICD-10-CM | POA: Diagnosis not present

## 2021-12-05 DIAGNOSIS — E785 Hyperlipidemia, unspecified: Secondary | ICD-10-CM | POA: Diagnosis not present

## 2021-12-05 DIAGNOSIS — R8281 Pyuria: Secondary | ICD-10-CM | POA: Diagnosis not present

## 2021-12-05 DIAGNOSIS — Z01818 Encounter for other preprocedural examination: Secondary | ICD-10-CM | POA: Diagnosis not present

## 2021-12-05 DIAGNOSIS — Z79899 Other long term (current) drug therapy: Secondary | ICD-10-CM | POA: Diagnosis not present

## 2021-12-05 DIAGNOSIS — Z23 Encounter for immunization: Secondary | ICD-10-CM | POA: Diagnosis not present

## 2021-12-05 DIAGNOSIS — N951 Menopausal and female climacteric states: Secondary | ICD-10-CM | POA: Diagnosis not present

## 2021-12-05 DIAGNOSIS — R7989 Other specified abnormal findings of blood chemistry: Secondary | ICD-10-CM | POA: Diagnosis not present

## 2021-12-09 DIAGNOSIS — M1712 Unilateral primary osteoarthritis, left knee: Secondary | ICD-10-CM

## 2021-12-10 NOTE — Progress Notes (Signed)
Your procedure is scheduled on:    12/23/21   Report to [facility] Main  Entrance   Report to admitting at   [phone]     Call this number if you have problems the morning of surgery [phone]    REMEMBER: NO  SOLID FOOD CANDY OR GUM AFTER MIDNIGHT. CLEAR LIQUIDS UNTIL  0700am         . NOTHING BY MOUTH EXCEPT CLEAR LIQUIDS UNTIL 0700am    . PLEASE FINISH ENSURE DRINK PER SURGEON ORDER  WHICH NEEDS TO BE COMPLETED AT     0700am  .      CLEAR LIQUID DIET   Foods Allowed                                                                    Coffee and tea, regular and decaf                            Fruit ices (not with fruit pulp)                                      Iced Popsicles                                    Carbonated beverages, regular and diet                                    Cranberry, grape and apple juices Sports drinks like Gatorade Lightly seasoned clear broth or consume(fat free) Sugar, honey syrup ___________________________________________________________________      BRUSH YOUR TEETH MORNING OF SURGERY AND RINSE YOUR MOUTH OUT, NO CHEWING GUM CANDY OR MINTS.     Take these medicines the morning of surgery with A SIP OF WATER: wellbutrin   DO NOT TAKE ANY DIABETIC MEDICATIONS DAY OF YOUR SURGERY                               You may not have any metal on your body including hair pins and              piercings  Do not wear jewelry, make-up, lotions, powders or perfumes, deodorant             Do not wear nail polish on your fingernails.  Do not shave  48 hours prior to surgery.              Men may shave face and neck.   Do not bring valuables to the hospital. [facility] IS NOT             RESPONSIBLE   FOR VALUABLES.  Contacts, dentures or bridgework may not be worn into surgery.  Leave suitcase in the car. After surgery it may be brought to your room.     Patients discharged the day of surgery will not be allowed to drive  home. IF YOU ARE HAVING SURGERY AND GOING HOME THE SAME DAY, YOU MUST HAVE AN ADULT TO DRIVE YOU HOME AND BE WITH YOU FOR 24 HOURS. YOU MAY GO HOME BY TAXI OR UBER OR ORTHERWISE, BUT AN ADULT MUST ACCOMPANY YOU HOME AND STAY WITH YOU FOR 24 HOURS.  Name and phone number of your driver:  Special Instructions: N/A              Please read over the following fact sheets you were given: _____________________________________________________________________  [facility] - Preparing for Surgery Before surgery, you can play an important role.  Because skin is not sterile, your skin needs to be as free of germs as possible.  You can reduce the number of germs on your skin by washing with CHG (chlorahexidine gluconate) soap before surgery.  CHG is an antiseptic cleaner which kills germs and bonds with the skin to continue killing germs even after washing. Please DO NOT use if you have an allergy to CHG or antibacterial soaps.  If your skin becomes reddened/irritated stop using the CHG and inform your nurse when you arrive at Short Stay. Do not shave (including legs and underarms) for at least 48 hours prior to the first CHG shower.  You may shave your face/neck. Please follow these instructions carefully:  1.  Shower with CHG Soap the night before surgery and the  morning of Surgery.  2.  If you choose to wash your hair, wash your hair first as usual with your  normal  shampoo.  3.  After you shampoo, rinse your hair and body thoroughly to remove the  shampoo.                           4.  Use CHG as you would any other liquid soap.  You can apply chg directly  to the skin and wash                       Gently with a scrungie or clean washcloth.  5.  Apply the CHG Soap to your body ONLY FROM THE NECK DOWN.   Do not use on face/ open                           Wound or open sores. Avoid contact with eyes, ears mouth and genitals (private parts).                       Wash face,  Genitals (private parts) with  your normal soap.             6.  Wash thoroughly, paying special attention to the area where your surgery  will be performed.  7.  Thoroughly rinse your body with warm water from the neck down.  8.  DO NOT shower/wash with your normal soap after using and rinsing off  the CHG Soap.                9.  Pat yourself dry with a clean towel.            10.  Wear clean pajamas.            11.  Place clean sheets on your bed the night of your first shower and do not  sleep with pets. Day of Surgery : Do not apply any lotions/deodorants the morning of  surgery.  Please wear clean clothes to the hospital/surgery center.  FAILURE TO FOLLOW THESE INSTRUCTIONS MAY RESULT IN THE CANCELLATION OF YOUR SURGERY PATIENT SIGNATURE_________________________________  NURSE SIGNATURE__________________________________  ________________________________________________________________________

## 2021-12-10 NOTE — Progress Notes (Addendum)
Anesthesia Review:  PCP: DR Francesco Sor- clearance dated 12/11/21 on chart  Cardiologist : none  Chest x-ray : EKG :12/05/21 on chart  Echo : Stress test: Cardiac Cath :  Activity level: can do a flight of stairs without difficulty  Sleep Study/ CPAP : has mild sleep apnea no cpap  Fasting Blood Sugar :      / Checks Blood Sugar -- times a day:   Blood Thinner/ Instructions /Last Dose: ASA / Instructions/ Last Dose :   No covid test- ambulatory surgery  Called office to see what labs they did at preop of DR Francesco Sor so as not to repeat labs.  [place] for call back from Med Records.  PT has multiple allergies.  PT does not think she has ever used hibiclens before.  Pt to try on test are of skin on arm on small area to make sure she is not allergic.  PT cannot use dial soap per pt .  IF she cannot use hibiclens she will use a clean bar of her own soap nite before and am of surgery and use clean towels and clean sheets.   PT allergic to nickel and latex. Pt reports surgeon aware.

## 2021-12-11 DIAGNOSIS — N8500 Endometrial hyperplasia, unspecified: Secondary | ICD-10-CM | POA: Diagnosis not present

## 2021-12-15 ENCOUNTER — Encounter (HOSPITAL_COMMUNITY): Payer: Self-pay

## 2021-12-15 ENCOUNTER — Encounter (HOSPITAL_COMMUNITY)
Admission: RE | Admit: 2021-12-15 | Discharge: 2021-12-15 | Disposition: A | Discharge status: Home / Self Care | Payer: Medicare PPO | Source: Ambulatory Visit | Attending: Orthopedic Surgery | Admitting: Orthopedic Surgery

## 2021-12-15 ENCOUNTER — Other Ambulatory Visit: Payer: Self-pay

## 2021-12-15 VITALS — BP 139/82 | HR 74 | Temp 98.3°F | Resp 16 | Ht 65.0 in | Wt 183.0 lb

## 2021-12-15 DIAGNOSIS — Z01812 Encounter for preprocedural laboratory examination: Secondary | ICD-10-CM | POA: Diagnosis not present

## 2021-12-15 DIAGNOSIS — M1712 Unilateral primary osteoarthritis, left knee: Secondary | ICD-10-CM | POA: Diagnosis not present

## 2021-12-15 DIAGNOSIS — Z79899 Other long term (current) drug therapy: Secondary | ICD-10-CM | POA: Diagnosis not present

## 2021-12-15 DIAGNOSIS — Z01818 Encounter for other preprocedural examination: Secondary | ICD-10-CM

## 2021-12-15 HISTORY — DX: Other complications of anesthesia, initial encounter: T88.59XA

## 2021-12-15 HISTORY — DX: Anxiety disorder, unspecified: F41.9

## 2021-12-15 HISTORY — DX: Anemia, unspecified: D64.9

## 2021-12-15 HISTORY — DX: Headache, unspecified: R51.9

## 2021-12-15 HISTORY — DX: Unspecified osteoarthritis, unspecified site: M19.90

## 2021-12-15 HISTORY — DX: Sleep apnea, unspecified: G47.30

## 2021-12-15 HISTORY — DX: Gastro-esophageal reflux disease without esophagitis: K21.9

## 2021-12-15 HISTORY — DX: Nausea with vomiting, unspecified: R11.2

## 2021-12-15 LAB — COMPREHENSIVE METABOLIC PANEL
ALT: 26 U/L (ref 0–44)
AST: 28 U/L (ref 15–41)
Albumin: 4.2 g/dL (ref 3.5–5.0)
Alkaline Phosphatase: 72 U/L (ref 38–126)
Anion gap: 8 (ref 5–15)
BUN: 18 mg/dL (ref 8–23)
CO2: 26 mmol/L (ref 22–32)
Calcium: 9.4 mg/dL (ref 8.9–10.3)
Chloride: 103 mmol/L (ref 98–111)
Creatinine, Ser: 0.77 mg/dL (ref 0.44–1.00)
GFR, Estimated: >60 mL/min (ref >60)
Glucose, Bld: 89 mg/dL (ref 70–99)
Potassium: 4.4 mmol/L (ref 3.5–5.1)
Sodium: 137 mmol/L (ref 135–145)
Total Bilirubin: 0.3 mg/dL (ref 0.3–1.2)
Total Protein: 7.7 g/dL (ref 6.5–8.1)

## 2021-12-15 LAB — SURGICAL PCR SCREEN
MRSA, PCR: NEGATIVE
Staphylococcus aureus: NEGATIVE

## 2021-12-15 LAB — CBC
HCT: 44.9 % (ref 36.0–46.0)
Hemoglobin: 14.6 g/dL (ref 12.0–15.0)
MCH: 29.4 pg (ref 26.0–34.0)
MCHC: 32.5 g/dL (ref 30.0–36.0)
MCV: 90.5 fL (ref 80.0–100.0)
Platelets: 270 10*3/uL (ref 150–400)
RBC: 4.96 MIL/uL (ref 3.87–5.11)
RDW: 13.2 % (ref 11.5–15.5)
WBC: 8.3 10*3/uL (ref 4.0–10.5)
nRBC: 0 % (ref 0.0–0.2)

## 2021-12-23 ENCOUNTER — Encounter (HOSPITAL_COMMUNITY)
Admission: RE | Disposition: A | Discharge status: Home / Self Care | Payer: Self-pay | Source: Ambulatory Visit | Attending: Orthopedic Surgery

## 2021-12-23 ENCOUNTER — Other Ambulatory Visit: Payer: Self-pay

## 2021-12-23 ENCOUNTER — Ambulatory Visit (HOSPITAL_COMMUNITY): Payer: Medicare PPO | Admitting: Physician Assistant

## 2021-12-23 ENCOUNTER — Ambulatory Visit (HOSPITAL_COMMUNITY): Payer: Medicare PPO | Admitting: Anesthesiology

## 2021-12-23 ENCOUNTER — Observation Stay (HOSPITAL_COMMUNITY)
Admission: RE | Admit: 2021-12-23 | Discharge: 2021-12-25 | Disposition: A | Discharge status: Home / Self Care | Payer: Medicare PPO | Source: Ambulatory Visit | Attending: Orthopedic Surgery | Admitting: Orthopedic Surgery

## 2021-12-23 ENCOUNTER — Encounter (HOSPITAL_COMMUNITY): Payer: Self-pay | Admitting: Orthopedic Surgery

## 2021-12-23 DIAGNOSIS — F419 Anxiety disorder, unspecified: Secondary | ICD-10-CM | POA: Insufficient documentation

## 2021-12-23 DIAGNOSIS — Z20822 Contact with and (suspected) exposure to covid-19: Secondary | ICD-10-CM | POA: Insufficient documentation

## 2021-12-23 DIAGNOSIS — K219 Gastro-esophageal reflux disease without esophagitis: Secondary | ICD-10-CM | POA: Insufficient documentation

## 2021-12-23 DIAGNOSIS — M1712 Unilateral primary osteoarthritis, left knee: Principal | ICD-10-CM | POA: Insufficient documentation

## 2021-12-23 DIAGNOSIS — E669 Obesity, unspecified: Secondary | ICD-10-CM | POA: Insufficient documentation

## 2021-12-23 DIAGNOSIS — Z96652 Presence of left artificial knee joint: Secondary | ICD-10-CM

## 2021-12-23 DIAGNOSIS — Z91040 Latex allergy status: Secondary | ICD-10-CM | POA: Diagnosis not present

## 2021-12-23 DIAGNOSIS — G8918 Other acute postprocedural pain: Secondary | ICD-10-CM | POA: Diagnosis not present

## 2021-12-23 DIAGNOSIS — G4730 Sleep apnea, unspecified: Secondary | ICD-10-CM | POA: Insufficient documentation

## 2021-12-23 HISTORY — PX: TOTAL KNEE ARTHROPLASTY: SHX125

## 2021-12-23 LAB — TYPE AND SCREEN
ABO/RH(D): O POS
Antibody Screen: NEGATIVE

## 2021-12-23 LAB — ABO/RH: ABO/RH(D): O POS

## 2021-12-23 LAB — SARS CORONAVIRUS 2 BY RT PCR (HOSPITAL ORDER, PERFORMED IN ~~LOC~~ HOSPITAL LAB): SARS Coronavirus 2: NEGATIVE

## 2021-12-23 SURGERY — ARTHROPLASTY, KNEE, TOTAL
Anesthesia: Spinal | Site: Knee | Laterality: Left

## 2021-12-23 MED ORDER — SENNA 8.6 MG PO TABS
4.0000 | ORAL_TABLET | Freq: Every day | ORAL | Status: DC
  Administered 2021-12-23 – 2021-12-25 (×3): 34.4 mg via ORAL
  Filled 2021-12-23 (×3): qty 4

## 2021-12-23 MED ORDER — PROGESTERONE MICRONIZED 100 MG PO CAPS
100.0000 mg | ORAL_CAPSULE | Freq: Every day | ORAL | Status: DC
  Filled 2021-12-23 (×3): qty 1

## 2021-12-23 MED ORDER — DEXAMETHASONE SODIUM PHOSPHATE 10 MG/ML IJ SOLN
10.0000 mg | Freq: Once | INTRAMUSCULAR | Status: AC
  Administered 2021-12-24: 10 mg via INTRAVENOUS
  Filled 2021-12-23: qty 1

## 2021-12-23 MED ORDER — SODIUM CHLORIDE (PF) 0.9 % IJ SOLN
INTRAMUSCULAR | Status: DC | PRN
  Administered 2021-12-23: 30 mL

## 2021-12-23 MED ORDER — DROPERIDOL 2.5 MG/ML IJ SOLN: 0.6250 mg | Freq: Once | INTRAMUSCULAR | Status: DC | PRN

## 2021-12-23 MED ORDER — ONDANSETRON HCL 4 MG/2ML IJ SOLN
INTRAMUSCULAR | Status: AC
  Filled 2021-12-23: qty 2

## 2021-12-23 MED ORDER — PHENYLEPHRINE HCL-NACL 20-0.9 MG/250ML-% IV SOLN
INTRAVENOUS | Status: DC | PRN
Start: 2021-12-23 — End: 2021-12-23
  Administered 2021-12-23: 20 ug/min via INTRAVENOUS

## 2021-12-23 MED ORDER — TIZANIDINE HCL 2 MG PO TABS
2.0000 mg | ORAL_TABLET | Freq: Four times a day (QID) | ORAL | Status: DC | PRN
  Filled 2021-12-23: qty 2

## 2021-12-23 MED ORDER — DOCUSATE SODIUM 100 MG PO CAPS
100.0000 mg | ORAL_CAPSULE | Freq: Two times a day (BID) | ORAL | Status: DC
  Administered 2021-12-23 – 2021-12-25 (×4): 100 mg via ORAL
  Filled 2021-12-23 (×4): qty 1

## 2021-12-23 MED ORDER — HYDROMORPHONE HCL 1 MG/ML IJ SOLN
0.5000 mg | INTRAMUSCULAR | Status: DC | PRN
  Administered 2021-12-23: 1 mg via INTRAVENOUS
  Administered 2021-12-23: 0.5 mg via INTRAVENOUS
  Filled 2021-12-23: qty 1

## 2021-12-23 MED ORDER — KETOROLAC TROMETHAMINE 30 MG/ML IJ SOLN
INTRAMUSCULAR | Status: DC | PRN
  Administered 2021-12-23: 30 mg

## 2021-12-23 MED ORDER — PROPOFOL 500 MG/50ML IV EMUL
INTRAVENOUS | Status: DC | PRN
  Administered 2021-12-23: 100 ug/kg/min via INTRAVENOUS

## 2021-12-23 MED ORDER — MENTHOL 3 MG MT LOZG: 1.0000 | LOZENGE | OROMUCOSAL | Status: DC | PRN

## 2021-12-23 MED ORDER — ONDANSETRON HCL 4 MG/2ML IJ SOLN
INTRAMUSCULAR | Status: DC | PRN
Start: 2021-12-23 — End: 2021-12-23
  Administered 2021-12-23: 4 mg via INTRAVENOUS

## 2021-12-23 MED ORDER — ACETAMINOPHEN 500 MG PO TABS
1000.0000 mg | ORAL_TABLET | Freq: Once | ORAL | Status: AC
  Administered 2021-12-23: 1000 mg via ORAL
  Filled 2021-12-23: qty 2

## 2021-12-23 MED ORDER — FLUOXETINE HCL 10 MG PO CAPS
10.0000 mg | ORAL_CAPSULE | Freq: Every day | ORAL | Status: DC
  Administered 2021-12-24 – 2021-12-25 (×2): 10 mg via ORAL
  Filled 2021-12-23 (×2): qty 1

## 2021-12-23 MED ORDER — PHENYLEPHRINE 40 MCG/ML (10ML) SYRINGE FOR IV PUSH (FOR BLOOD PRESSURE SUPPORT)
PREFILLED_SYRINGE | INTRAVENOUS | Status: DC | PRN
  Administered 2021-12-23 (×4): 80 ug via INTRAVENOUS

## 2021-12-23 MED ORDER — MIDAZOLAM HCL 2 MG/2ML IJ SOLN
1.0000 mg | INTRAMUSCULAR | Status: AC
  Administered 2021-12-23: 1 mg via INTRAVENOUS
  Filled 2021-12-23: qty 2

## 2021-12-23 MED ORDER — TRANEXAMIC ACID-NACL 1000-0.7 MG/100ML-% IV SOLN
1000.0000 mg | INTRAVENOUS | Status: AC
  Administered 2021-12-23: 1000 mg via INTRAVENOUS
  Filled 2021-12-23: qty 100

## 2021-12-23 MED ORDER — HYDROMORPHONE HCL 1 MG/ML IJ SOLN
INTRAMUSCULAR | Status: AC
  Filled 2021-12-23: qty 1

## 2021-12-23 MED ORDER — ACETAMINOPHEN 325 MG PO TABS
325.0000 mg | ORAL_TABLET | Freq: Four times a day (QID) | ORAL | Status: DC | PRN
  Administered 2021-12-24: 325 mg via ORAL
  Administered 2021-12-25: 650 mg via ORAL
  Filled 2021-12-23 (×2): qty 2

## 2021-12-23 MED ORDER — DEXAMETHASONE SODIUM PHOSPHATE 10 MG/ML IJ SOLN: 8.0000 mg | Freq: Once | INTRAMUSCULAR | Status: DC

## 2021-12-23 MED ORDER — CLONIDINE HCL (ANALGESIA) 100 MCG/ML EP SOLN
EPIDURAL | Status: DC | PRN
  Administered 2021-12-23: 80 ug

## 2021-12-23 MED ORDER — BUPIVACAINE IN DEXTROSE 0.75-8.25 % IT SOLN
INTRATHECAL | Status: DC | PRN
  Administered 2021-12-23: 1.6 mL via INTRATHECAL

## 2021-12-23 MED ORDER — TRANEXAMIC ACID-NACL 1000-0.7 MG/100ML-% IV SOLN
1000.0000 mg | Freq: Once | INTRAVENOUS | Status: AC
  Administered 2021-12-23: 1000 mg via INTRAVENOUS
  Filled 2021-12-23: qty 100

## 2021-12-23 MED ORDER — BUPIVACAINE-EPINEPHRINE (PF) 0.25% -1:200000 IJ SOLN
INTRAMUSCULAR | Status: AC
  Filled 2021-12-23: qty 30

## 2021-12-23 MED ORDER — PROPOFOL 10 MG/ML IV BOLUS
INTRAVENOUS | Status: DC | PRN
  Administered 2021-12-23: 20 mg via INTRAVENOUS
  Administered 2021-12-23: 30 mg via INTRAVENOUS

## 2021-12-23 MED ORDER — ATORVASTATIN CALCIUM 10 MG PO TABS
10.0000 mg | ORAL_TABLET | Freq: Every day | ORAL | Status: DC
  Administered 2021-12-23 – 2021-12-24 (×2): 10 mg via ORAL
  Filled 2021-12-23 (×2): qty 1

## 2021-12-23 MED ORDER — CEFAZOLIN SODIUM-DEXTROSE 2-4 GM/100ML-% IV SOLN
2.0000 g | Freq: Four times a day (QID) | INTRAVENOUS | Status: AC
  Administered 2021-12-23 (×2): 2 g via INTRAVENOUS
  Filled 2021-12-23 (×2): qty 100

## 2021-12-23 MED ORDER — DEXAMETHASONE SODIUM PHOSPHATE 10 MG/ML IJ SOLN
INTRAMUSCULAR | Status: AC
  Filled 2021-12-23: qty 1

## 2021-12-23 MED ORDER — FENTANYL CITRATE PF 50 MCG/ML IJ SOSY
50.0000 ug | PREFILLED_SYRINGE | INTRAMUSCULAR | Status: AC
  Administered 2021-12-23: 50 ug via INTRAVENOUS
  Filled 2021-12-23: qty 2

## 2021-12-23 MED ORDER — LIDOCAINE 2% (20 MG/ML) 5 ML SYRINGE
INTRAMUSCULAR | Status: DC | PRN
  Administered 2021-12-23: 30 mg via INTRAVENOUS
  Administered 2021-12-23: 20 mg via INTRAVENOUS

## 2021-12-23 MED ORDER — PHENYLEPHRINE HCL (PRESSORS) 10 MG/ML IV SOLN
INTRAVENOUS | Status: AC
  Filled 2021-12-23: qty 1

## 2021-12-23 MED ORDER — PHENOL 1.4 % MT LIQD: 1.0000 | OROMUCOSAL | Status: DC | PRN

## 2021-12-23 MED ORDER — POVIDONE-IODINE 10 % EX SWAB
2.0000 "application " | Freq: Once | CUTANEOUS | Status: AC
  Administered 2021-12-23: 2 via TOPICAL

## 2021-12-23 MED ORDER — ONDANSETRON HCL 4 MG PO TABS
4.0000 mg | ORAL_TABLET | Freq: Four times a day (QID) | ORAL | Status: DC | PRN
  Filled 2021-12-23: qty 1

## 2021-12-23 MED ORDER — ONDANSETRON HCL 4 MG/2ML IJ SOLN: 4.0000 mg | Freq: Four times a day (QID) | INTRAMUSCULAR | Status: DC | PRN

## 2021-12-23 MED ORDER — LACTATED RINGERS IV SOLN: INTRAVENOUS | Status: DC

## 2021-12-23 MED ORDER — FERROUS SULFATE 325 (65 FE) MG PO TABS
325.0000 mg | ORAL_TABLET | Freq: Three times a day (TID) | ORAL | Status: DC
  Administered 2021-12-24 – 2021-12-25 (×5): 325 mg via ORAL
  Filled 2021-12-23 (×5): qty 1

## 2021-12-23 MED ORDER — 0.9 % SODIUM CHLORIDE (POUR BTL) OPTIME
TOPICAL | Status: DC | PRN
  Administered 2021-12-23: 1000 mL

## 2021-12-23 MED ORDER — POLYETHYLENE GLYCOL 3350 17 G PO PACK: 17.0000 g | PACK | Freq: Every day | ORAL | Status: DC | PRN

## 2021-12-23 MED ORDER — SODIUM CHLORIDE (PF) 0.9 % IJ SOLN
INTRAMUSCULAR | Status: AC
  Filled 2021-12-23: qty 30

## 2021-12-23 MED ORDER — FENTANYL CITRATE PF 50 MCG/ML IJ SOSY: 25.0000 ug | PREFILLED_SYRINGE | INTRAMUSCULAR | Status: DC | PRN

## 2021-12-23 MED ORDER — ARMODAFINIL 150 MG PO TABS: 75.0000 mg | ORAL_TABLET | Freq: Every day | ORAL | Status: DC

## 2021-12-23 MED ORDER — EPHEDRINE SULFATE-NACL 50-0.9 MG/10ML-% IV SOSY
PREFILLED_SYRINGE | INTRAVENOUS | Status: DC | PRN
  Administered 2021-12-23: 5 mg via INTRAVENOUS
  Administered 2021-12-23: 10 mg via INTRAVENOUS
  Administered 2021-12-23: 5 mg via INTRAVENOUS

## 2021-12-23 MED ORDER — METOCLOPRAMIDE HCL 5 MG PO TABS
5.0000 mg | ORAL_TABLET | Freq: Three times a day (TID) | ORAL | Status: DC | PRN
  Filled 2021-12-23: qty 2

## 2021-12-23 MED ORDER — SODIUM CHLORIDE 0.9 % IV SOLN: INTRAVENOUS | Status: DC

## 2021-12-23 MED ORDER — CEFAZOLIN SODIUM-DEXTROSE 2-4 GM/100ML-% IV SOLN
2.0000 g | INTRAVENOUS | Status: AC
  Administered 2021-12-23: 2 g via INTRAVENOUS
  Filled 2021-12-23: qty 100

## 2021-12-23 MED ORDER — ASPIRIN 81 MG PO CHEW
81.0000 mg | CHEWABLE_TABLET | Freq: Two times a day (BID) | ORAL | Status: DC
  Administered 2021-12-23 – 2021-12-25 (×4): 81 mg via ORAL
  Filled 2021-12-23 (×4): qty 1

## 2021-12-23 MED ORDER — BUPROPION HCL ER (XL) 300 MG PO TB24
300.0000 mg | ORAL_TABLET | Freq: Every day | ORAL | Status: DC
Start: 2021-12-24 — End: 2021-12-25
  Administered 2021-12-24 – 2021-12-25 (×2): 300 mg via ORAL
  Filled 2021-12-23 (×2): qty 1

## 2021-12-23 MED ORDER — MODAFINIL 200 MG PO TABS
100.0000 mg | ORAL_TABLET | Freq: Every day | ORAL | Status: DC
  Administered 2021-12-25: 100 mg via ORAL
  Filled 2021-12-23 (×2): qty 1

## 2021-12-23 MED ORDER — BUPIVACAINE-EPINEPHRINE (PF) 0.25% -1:200000 IJ SOLN
INTRAMUSCULAR | Status: DC | PRN
  Administered 2021-12-23: 30 mL via PERINEURAL

## 2021-12-23 MED ORDER — STERILE WATER FOR IRRIGATION IR SOLN
Status: DC | PRN
  Administered 2021-12-23 (×2): 1000 mL

## 2021-12-23 MED ORDER — BISACODYL 10 MG RE SUPP: 10.0000 mg | Freq: Every day | RECTAL | Status: DC | PRN

## 2021-12-23 MED ORDER — ROPIVACAINE HCL 7.5 MG/ML IJ SOLN
INTRAMUSCULAR | Status: DC | PRN
  Administered 2021-12-23: 20 mL via PERINEURAL

## 2021-12-23 MED ORDER — MEPERIDINE HCL 50 MG/ML IJ SOLN: 6.2500 mg | INTRAMUSCULAR | Status: DC | PRN

## 2021-12-23 MED ORDER — HYDROMORPHONE HCL 2 MG PO TABS
2.0000 mg | ORAL_TABLET | ORAL | Status: DC | PRN
  Administered 2021-12-23 – 2021-12-24 (×5): 2 mg via ORAL
  Administered 2021-12-25 (×2): 4 mg via ORAL
  Administered 2021-12-25 (×2): 2 mg via ORAL
  Filled 2021-12-23 (×2): qty 2
  Filled 2021-12-23 (×3): qty 1
  Filled 2021-12-23: qty 2
  Filled 2021-12-23 (×3): qty 1

## 2021-12-23 MED ORDER — KETOROLAC TROMETHAMINE 30 MG/ML IJ SOLN
INTRAMUSCULAR | Status: AC
  Filled 2021-12-23: qty 1

## 2021-12-23 MED ORDER — PROGESTERONE MICRONIZED 100 MG PO CAPS: 100.0000 mg | ORAL_CAPSULE | Freq: Every day | ORAL | Status: DC

## 2021-12-23 MED ORDER — METOCLOPRAMIDE HCL 5 MG/ML IJ SOLN: 5.0000 mg | Freq: Three times a day (TID) | INTRAMUSCULAR | Status: DC | PRN

## 2021-12-23 MED ORDER — DEXAMETHASONE SODIUM PHOSPHATE 4 MG/ML IJ SOLN
INTRAMUSCULAR | Status: DC | PRN
Start: 2021-12-23 — End: 2021-12-23
  Administered 2021-12-23: 5 mg via PERINEURAL
  Administered 2021-12-23: 8 mg via PERINEURAL

## 2021-12-23 MED ORDER — DIPHENHYDRAMINE HCL 12.5 MG/5ML PO ELIX
12.5000 mg | ORAL_SOLUTION | ORAL | Status: DC | PRN
  Administered 2021-12-23: 25 mg via ORAL
  Filled 2021-12-23: qty 10

## 2021-12-23 MED ORDER — PROPOFOL 1000 MG/100ML IV EMUL
INTRAVENOUS | Status: AC
  Filled 2021-12-23: qty 100

## 2021-12-23 MED ORDER — SODIUM CHLORIDE 0.9 % IR SOLN
Status: DC | PRN
  Administered 2021-12-23: 1000 mL

## 2021-12-23 SURGICAL SUPPLY — 54 items
BAG COUNTER SPONGE SURGICOUNT (BAG) IMPLANT
BAG ZIPLOCK 12X15 (MISCELLANEOUS) IMPLANT
BLADE SAW SGTL 11.0X1.19X90.0M (BLADE) IMPLANT
BLADE SAW SGTL 13.0X1.19X90.0M (BLADE) ×2 IMPLANT
BLADE SURG SZ10 CARB STEEL (BLADE) ×4 IMPLANT
BNDG ELASTIC 6X5.8 VLCR STR LF (GAUZE/BANDAGES/DRESSINGS) ×2 IMPLANT
BOWL SMART MIX CTS (DISPOSABLE) ×2 IMPLANT
CEMENT HV SMART SET (Cement) ×2 IMPLANT
COMP FEM CMT PS 5 LT (Joint) ×2 IMPLANT
COMPONENT FEM CMT PS 5 LT (Joint) IMPLANT
CUFF TOURN SGL QUICK 34 (TOURNIQUET CUFF) ×2
CUFF TRNQT CYL 34X4.125X (TOURNIQUET CUFF) ×1 IMPLANT
DERMABOND ADVANCED (GAUZE/BANDAGES/DRESSINGS) ×1
DERMABOND ADVANCED .7 DNX12 (GAUZE/BANDAGES/DRESSINGS) ×1 IMPLANT
DRAPE INCISE IOBAN 66X45 STRL (DRAPES) ×2 IMPLANT
DRAPE U-SHAPE 47X51 STRL (DRAPES) ×2 IMPLANT
DRESSING AQUACEL AG SP 3.5X10 (GAUZE/BANDAGES/DRESSINGS) ×1 IMPLANT
DRSG AQUACEL AG SP 3.5X10 (GAUZE/BANDAGES/DRESSINGS) ×2
DURAPREP 26ML APPLICATOR (WOUND CARE) ×4 IMPLANT
ELECT REM PT RETURN 15FT ADLT (MISCELLANEOUS) ×2 IMPLANT
GLOVE SURG ENC MOIS LTX SZ6 (GLOVE) ×2 IMPLANT
GLOVE SURG ENC MOIS LTX SZ7 (GLOVE) ×2 IMPLANT
GLOVE SURG UNDER POLY LF SZ7.5 (GLOVE) ×2 IMPLANT
GOWN STRL REUS W/TWL LRG LVL3 (GOWN DISPOSABLE) ×2 IMPLANT
HANDPIECE INTERPULSE COAX TIP (DISPOSABLE) ×2
HDLS TROCR DRIL PIN KNEE 75 (PIN) ×2
HOLDER FOLEY CATH W/STRAP (MISCELLANEOUS) IMPLANT
KIT TURNOVER KIT A (KITS) IMPLANT
LINER TIB ASF PS EF/4-5 11 LT (Liner) ×1 IMPLANT
MANIFOLD NEPTUNE II (INSTRUMENTS) ×2 IMPLANT
NDL SAFETY ECLIPSE 18X1.5 (NEEDLE) IMPLANT
NEEDLE HYPO 18GX1.5 SHARP (NEEDLE)
NS IRRIG 1000ML POUR BTL (IV SOLUTION) ×2 IMPLANT
PACK TOTAL KNEE CUSTOM (KITS) ×2 IMPLANT
PIN DRILL HDLS TROCAR 75 4PK (PIN) IMPLANT
PROTECTOR NERVE ULNAR (MISCELLANEOUS) ×2 IMPLANT
SCREW FEMALE HEX FIX 25X2.5 (ORTHOPEDIC DISPOSABLE SUPPLIES) ×1 IMPLANT
SET HNDPC FAN SPRY TIP SCT (DISPOSABLE) ×1 IMPLANT
SET PAD KNEE POSITIONER (MISCELLANEOUS) ×2 IMPLANT
SPIKE FLUID TRANSFER (MISCELLANEOUS) ×4 IMPLANT
SPONGE T-LAP 18X18 ~~LOC~~+RFID (SPONGE) ×6 IMPLANT
STEM POLY PAT PLY 35M KNEE (Knees) ×1 IMPLANT
STEM TIBIA 5 DEG SZ E L KNEE (Knees) IMPLANT
SUT MNCRL AB 4-0 PS2 18 (SUTURE) ×2 IMPLANT
SUT STRATAFIX PDS+ 0 24IN (SUTURE) ×2 IMPLANT
SUT VIC AB 1 CT1 36 (SUTURE) ×2 IMPLANT
SUT VIC AB 2-0 CT1 27 (SUTURE) ×6
SUT VIC AB 2-0 CT1 TAPERPNT 27 (SUTURE) ×3 IMPLANT
SYR 3ML LL SCALE MARK (SYRINGE) ×2 IMPLANT
TIBIA STEM 5 DEG SZ E L KNEE (Knees) ×2 IMPLANT
TRAY FOLEY MTR SLVR 16FR STAT (SET/KITS/TRAYS/PACK) ×2 IMPLANT
TUBE SUCTION HIGH CAP CLEAR NV (SUCTIONS) ×2 IMPLANT
WATER STERILE IRR 1000ML POUR (IV SOLUTION) ×4 IMPLANT
WRAP KNEE MAXI GEL POST OP (GAUZE/BANDAGES/DRESSINGS) ×2 IMPLANT

## 2021-12-23 NOTE — Discharge Instructions (Signed)

## 2021-12-23 NOTE — Anesthesia Procedure Notes (Signed)
Anesthesia Regional Block: Adductor canal block   Pre-Anesthetic Checklist: , timeout performed,  Correct Patient, Correct Site, Correct Laterality,  Correct Procedure, Correct Position, site marked,  Risks and benefits discussed,  Surgical consent,  Pre-op evaluation,  At surgeon's request and post-op pain management  Laterality: Lower and Left  Prep: chloraprep       Needles:  Injection technique: Single-shot  Needle Type: Stimiplex     Needle Length: 9cm  Needle Gauge: 21     Additional Needles:   Procedures:,,,, ultrasound used (permanent image in chart),,    Narrative:  Start time: 12/23/2021 9:11 AM End time: 12/23/2021 9:21 AM Injection made incrementally with aspirations every 5 mL.  Performed by: Personally  Anesthesiologist: Lewie Loron, MD  Additional Notes: BP cuff, EKG monitors applied. Sedation begun. Artery and nerve location verified with ultrasound. Anesthetic injected incrementally (62ml), slowly, and after negative aspirations under direct u/s guidance. Good fascial/perineural spread. Tolerated well.

## 2021-12-23 NOTE — Interval H&P Note (Signed)
History and Physical Interval Note:  12/23/2021 8:48 AM  Cheryl Schmitt  has presented today for surgery, with the diagnosis of Left knee osteoarthritis.  The various methods of treatment have been discussed with the patient and family. After consideration of risks, benefits and other options for treatment, the patient has consented to  Procedure(s): TOTAL KNEE ARTHROPLASTY (Left) as a surgical intervention.  The patient's history has been reviewed, patient examined, no change in status, stable for surgery.  I have reviewed the patient's chart and labs.  Questions were answered to the patient's satisfaction.     [name]

## 2021-12-23 NOTE — Op Note (Signed)
NAMEELVIN, BANKER MEDICAL RECORD NO: [id number] ACCOUNT NO: [id number] DATE OF BIRTH: 21-May-1951 FACILITY: [facility]ATION: [id number] PHYSICIAN: Madlyn Frankel. Charlann Boxer, MD  Operative Report   DATE OF PROCEDURE: 12/23/2021  PREOPERATIVE DIAGNOSIS:  Left knee osteoarthritis and pain.  POSTOPERATIVE DIAGNOSIS:  Left knee osteoarthritis and pain.  PROCEDURE:  Left total knee arthroplasty.  COMPONENTS USED:  Biomet Persona knee replacement with a size 5 nitrided or titanium-coated femoral component with a size E left tibial baseplate, a 35 patellar button and an 11 mm medial congruent polyethylene bearing surface to match the E tibia.  SURGEON:  Madlyn Frankel. Charlann Boxer, MD  ASSISTANT:  Rosalene Billings, PA-C.  Note that Ms. Domenic Schwab was present for the entirety of the case from preoperative positioning, perioperative management of the operative extremity, general facilitation of the case and primary wound closure.  ANESTHESIA:  Regional plus spinal.  SPECIMENS:  None.  COMPLICATIONS:  None.  DRAINS:  None.  TOURNIQUET:  Up for 35 minutes at 225 mmHg.  INDICATION FOR PROCEDURE:  The patient is a pleasant 71 year old female who had been seen and evaluated in the office for knee arthritis management over the past year or more.  She had tried and failed medications and cortisone injections.  She was  referred for surgical management and consideration of knee arthroplasty based on the valgus nature of her knee.  We reviewed the failure of conservative treatment to provide adequate functional quality of life benefits.  We thus discussed total knee  replacement as definitive treatment.  We reviewed the risk of infection, DVT, component failure, stiffness, and need for future surgeries.  We discussed and reviewed the postoperative course and effort required to maximize her functional return.  Consent  was obtained for the benefit of pain relief.  PROCEDURE IN DETAIL:  The patient was brought to operative theater.   Once adequate anesthesia, preoperative antibiotics, Ancef administered, she was positioned supine with a left thigh tourniquet placed.  The left lower extremity was then prepped and  draped in sterile fashion.  A timeout was performed identifying the patient, planned procedure, and extremity.  The leg was exsanguinated and tourniquet elevated to 225 mmHg.  Midline incision was made followed by median arthrotomy.  In the joint we  identified significant knee effusion, synovitis as well as advanced degenerative changes involving the lateral and patellofemoral compartments.  Following initial exposure, attention was directed to the patella.  Precut measurement of the patella was  noted to be about 21-22 mm.  I resected down to 14 mm and used a 35 patellar button.  Lug holes were drilled. In the patella a trial polyethylene button was placed.  Attention was now directed to the femur.  The femoral canal was opened with a drill,  irrigated to try to prevent fat emboli.  The intramedullary rod was then passed and at 5 degrees of valgus, I resected 10 mm off the distal femur.  Following this, we subluxated tibia anteriorly.  I used an extramedullary guide and had it set up to be  perpendicular in the coronal plane as well as parallel in the sagittal plane.  We made a proximal tibial cut and then confirmed that the gap space would be stable with a 10 mm block.  I then can check to find out that the tibial tray was perpendicular  using alignment rod.  Once that was done, we sized the femur to be a size 5.  The drill holes were placed.  The size 5 cutting block  was pinned into place and the anterior, posterior and chamfer cuts were then made without difficulty, nor notching.  At  this point, the trial femoral component was placed.  The tibia was subluxated anteriorly.  I then sized the tibia to be best fit with a size E tibial baseplate, which was pinned into place, drilled and keel punched.  A trial reduction was now  carried  out.  With a trial reduction, I found that the size 11 insert was very good with her extension and stable into flexion.  The patella tracked through the trochlea without application of pressure. Given all these findings, the trial components were  removed.  We selected the 5 narrow component.  The knee was copiously irrigated with normal saline solution.  The synovial capsular  junction in the knee was injected with 0.25% Marcaine with epinephrine, 1 mL of Toradol and saline.  The cement was mixed  and the final components were then cemented on the clean and dried cut surface of the bone.  Once the cement had fully cured, the final 11 mm insert was opened and placed into the knee.  Excessive cement was removed throughout the knee, making certain  there was no evidence of any potential loose third body debris.  The knee was re-irrigated.  The extensor mechanism was then reapproximated using #1 Vicryl and #1 Stratafix suture with the knee in flexion.  The remainder of the wound was closed in layers  with 2-0 Vicryl and a running Monocryl.  The knee was clean, dry and dressed sterilely using surgical glue and Aquacel dressing.  Patient was brought to the postoperative recovery room in stable condition, tolerated the procedure well.  Findings  reviewed with family.   VAI D: 12/23/2021 11:40:24 am T: 12/23/2021 11:32:00 pm  JOB: [id number]/ [id number]

## 2021-12-23 NOTE — Brief Op Note (Signed)
12/23/2021  11:33 AM  PATIENT:  Cheryl Schmitt  71 y.o. female  PRE-OPERATIVE DIAGNOSIS:  Left knee osteoarthritis  POST-OPERATIVE DIAGNOSIS:  Left knee osteoarthritis  PROCEDURE:  Procedure(s): TOTAL KNEE ARTHROPLASTY (Left)  SURGEON:  Surgeon(s) and Role:    Durene Romans, MD - Primary  PHYSICIAN ASSISTANT: Rosalene Billings, PA-C  ANESTHESIA:   regional and spinal  EBL:  <200 cc  BLOOD ADMINISTERED:none  DRAINS: none   LOCAL MEDICATIONS USED:  MARCAINE     SPECIMEN:  No Specimen  DISPOSITION OF SPECIMEN:  N/A  COUNTS:  YES  TOURNIQUET:   Total Tourniquet Time Documented: Thigh (Right) - 35 minutes Total: Thigh (Right) - 35 minutes   DICTATION: .Other Dictation: Dictation Number [id number]  PLAN OF CARE: Admit for overnight observation  PATIENT DISPOSITION:  PACU - hemodynamically stable.   Delay start of Pharmacological VTE agent (>24hrs) due to surgical blood loss or risk of bleeding: no

## 2021-12-23 NOTE — Anesthesia Preprocedure Evaluation (Addendum)
Anesthesia Evaluation  Patient identified by MRN, date of birth, ID band Patient awake    Reviewed: Allergy & Precautions, NPO status , Patient's Chart, lab work & pertinent test results  History of Anesthesia Complications (+) PONV and history of anesthetic complications  Airway Mallampati: II  TM Distance: >3 FB Neck ROM: Full    Dental  (+) Dental Advisory Given, Teeth Intact   Pulmonary sleep apnea ,    Pulmonary exam normal breath sounds clear to auscultation       Cardiovascular negative cardio ROS Normal cardiovascular exam Rhythm:Regular Rate:Normal     Neuro/Psych  Headaches, PSYCHIATRIC DISORDERS Anxiety Depression    GI/Hepatic Neg liver ROS, GERD  ,  Endo/Other  negative endocrine ROS  Renal/GU negative Renal ROS     Musculoskeletal  (+) Arthritis ,   Abdominal (+) + obese,   Peds  Hematology  (+) Blood dyscrasia, anemia ,   Anesthesia Other Findings   Reproductive/Obstetrics                           Anesthesia Physical Anesthesia Plan  ASA: 2  Anesthesia Plan: Spinal   Post-op Pain Management: Tylenol PO (pre-op) and Regional block   Induction:   PONV Risk Score and Plan: 4 or greater and Ondansetron, Dexamethasone, Treatment may vary due to age or medical condition and Midazolam  Airway Management Planned: Natural Airway  Additional Equipment:   Intra-op Plan:   Post-operative Plan:   Informed Consent: I have reviewed the patients History and Physical, chart, labs and discussed the procedure including the risks, benefits and alternatives for the proposed anesthesia with the patient or authorized representative who has indicated his/her understanding and acceptance.     Dental advisory given  Plan Discussed with: CRNA  Anesthesia Plan Comments:        Anesthesia Quick Evaluation

## 2021-12-23 NOTE — Anesthesia Procedure Notes (Signed)
Spinal  Patient location during procedure: OR Start time: 12/23/2021 10:00 AM End time: 12/23/2021 10:06 AM Reason for block: surgical anesthesia Staffing Performed: anesthesiologist  Anesthesiologist: Nolon Nations, MD Preanesthetic Checklist Completed: patient identified, IV checked, site marked, risks and benefits discussed, surgical consent, monitors and equipment checked, pre-op evaluation and timeout performed Spinal Block Prep: DuraPrep and site prepped and draped Patient monitoring: heart rate, continuous pulse ox and blood pressure Approach: right paramedian Location: L3-4 Injection technique: single-shot Needle Needle type: Spinocan  Needle gauge: 25 G Needle length: 9 cm Additional Notes Expiration date of kit checked and confirmed. Patient tolerated procedure well, without complications.

## 2021-12-23 NOTE — Care Plan (Signed)
Ortho Bundle Case Management Note  Patient Details  Name: Cheryl Schmitt MRN: [id number] Date of Birth: 1951/07/17                  L TKA on 12-23-21 DCP: Home with neighbor DME: RW ordered through [facility] PT: EO on 12-26-21   DME Arranged:  Gilford Rile rolling DME Agency:  [employer]  HH Arranged:    [place] Agency:     Additional Comments: Please contact me with any questions of if this plan should need to change.  Marianne Sofia, RN,CCM [facility]  [phone] 12/23/2021, 8:30 AM

## 2021-12-23 NOTE — H&P (Signed)
TOTAL KNEE ADMISSION H&P  Patient is being admitted for left total knee arthroplasty.  Subjective:  Chief Complaint:left knee pain.  HPI: Cheryl Schmitt, 71 y.o. female, has a history of pain and functional disability in the left knee due to arthritis and has failed non-surgical conservative treatments for greater than 12 weeks to includeNSAID's and/or analgesics, corticosteriod injections, and activity modification.  Onset of symptoms was gradual, starting 2 years ago with gradually worsening course since that time. The patient noted prior procedures on the knee to include  arthroscopy and menisectomy on the left knee(s).  Patient currently rates pain in the left knee(s) at 8 out of 10 with activity. Patient has worsening of pain with activity and weight bearing, pain that interferes with activities of daily living, and pain with passive range of motion.  Patient has evidence of joint space narrowing by imaging studies. There is no active infection.  There are no problems to display for this patient.  Past Medical History:  Diagnosis Date   Anemia    Anxiety    Arthritis    Complication of anesthesia    extesnvie shaking   Depression    GERD (gastroesophageal reflux disease)    Headache    PONV (postoperative nausea and vomiting)    Sleep apnea    no cpap    Past Surgical History:  Procedure Laterality Date   APPENDECTOMY     hemoprrhoidectomy      lef tknee arthroscopy      left ankle surgery      TONSILLECTOMY      No current facility-administered medications for this encounter.   Current Outpatient Medications  Medication Sig Dispense Refill Last Dose   Armodafinil 150 MG tablet Take 75 mg by mouth daily.      Ascorbic Acid (VITAMIN C) 1000 MG tablet Take 1,000 mg by mouth daily.      atorvastatin (LIPITOR) 10 MG tablet Take 10 mg by mouth at bedtime.      Azelaic Acid 15 % gel Apply 1 application topically daily as needed (rosacea).      Biotin 10000 MCG TABS Take 10,000  mcg by mouth daily.      buPROPion (WELLBUTRIN XL) 300 MG 24 hr tablet Take 300 mg by mouth daily.      Calcium Carbonate-Vit D-Min (CALCIUM 1200 PO) Take 1,200 mg by mouth daily.      carboxymethylcellulose (REFRESH PLUS) 0.5 % SOLN Place 1 drop into both eyes 3 (three) times daily as needed (dry eyes).      Cetirizine-Pseudoephedrine (ZYRTEC-D PO) Take 1 tablet by mouth daily.      desonide (DESOWEN) 0.05 % cream Apply 1 application topically daily as needed (facial breakouts).      FLUoxetine (PROZAC) 10 MG tablet Take 10 mg by mouth daily.      metroNIDAZOLE (METROGEL) 0.75 % gel Apply 1 application topically daily.      Multiple Vitamin (MULTIVITAMIN WITH MINERALS) TABS tablet Take 1 tablet by mouth daily.      progesterone (PROMETRIUM) 100 MG capsule Take 100 mg by mouth at bedtime.      senna (SENOKOT) 8.6 MG TABS tablet Take 4 tablets by mouth daily.      sodium chloride (OCEAN) 0.65 % SOLN nasal spray Place 1 spray into both nostrils as needed for congestion.      nabumetone (RELAFEN) 750 MG tablet Take 1 tablet (750 mg total) by mouth 2 (two) times daily as needed. (Patient not taking: Reported on  12/09/2021) 60 tablet 6 Not Taking   tiZANidine (ZANAFLEX) 2 MG tablet Take 1-2 tablets (2-4 mg total) by mouth every 6 (six) hours as needed for muscle spasms. (Patient not taking: Reported on 12/09/2021) 60 tablet 1 Not Taking   Allergies  Allergen Reactions   Anidulafungin Shortness Of Breath    Pt states she never taken this drug   Codeine Anaphylaxis   Erythromycin Anaphylaxis   Other Shortness Of Breath and Rash    Fragrance   Sulfa Antibiotics Anaphylaxis   Aspirin Other (See Comments)    Upset stomach when too much is taken   Latex Rash   Nickel Itching and Rash    Social History   Tobacco Use   Smoking status: Never   Smokeless tobacco: Never  Substance Use Topics   Alcohol use: Yes    Comment: rare    No family history on file.   Review of Systems  Constitutional:   Negative for chills and fever.  Respiratory:  Negative for cough and shortness of breath.   Cardiovascular:  Negative for chest pain.  Gastrointestinal:  Negative for nausea and vomiting.  Musculoskeletal:  Positive for arthralgias.    Objective:  Physical Exam Well nourished and well developed. General: Alert and oriented x3, cooperative and pleasant, no acute distress. Head: normocephalic, atraumatic, neck supple. Eyes: EOMI.  Musculoskeletal: Left knee exam: Slight valgus to the left knee No palpable effusion, warmth erythema No significant flexion contracture Flexion to 120 degrees with stable medial lateral collateral ligaments and a passively correctable valgus Neurovascularly intact without lower extremity edema, erythema or calf tenderness Normal ipsilateral left hip exam without groin pain or referred pain Right knee exam reveals no palpable effusion with more neutral alignment with only mild tenderness laterally   Calves soft and nontender. Motor function intact in LE. Strength 5/5 LE bilaterally. Neuro: Distal pulses 2+. Sensation to light touch intact in LE.  Vital signs in last 24 hours:    Labs:   Estimated body mass index is 30.45 kg/m as calculated from the following:   Height as of 12/15/21: 5\' 5"  (1.651 m).   Weight as of 12/15/21: 83 kg.   Imaging Review Plain radiographs demonstrate severe degenerative joint disease of the left knee(s). The overall alignment isneutral. The bone quality appears to be adequate for age and reported activity level.      Assessment/Plan:  End stage arthritis, left knee   The patient history, physical examination, clinical judgment of the provider and imaging studies are consistent with end stage degenerative joint disease of the left knee(s) and total knee arthroplasty is deemed medically necessary. The treatment options including medical management, injection therapy arthroscopy and arthroplasty were discussed at  length. The risks and benefits of total knee arthroplasty were presented and reviewed. The risks due to aseptic loosening, infection, stiffness, patella tracking problems, thromboembolic complications and other imponderables were discussed. The patient acknowledged the explanation, agreed to proceed with the plan and consent was signed. Patient is being admitted for inpatient treatment for surgery, pain control, PT, OT, prophylactic antibiotics, VTE prophylaxis, progressive ambulation and ADL's and discharge planning. The patient is planning to be discharged  home.  Therapy Plans: outpatient therapy at [facility] Disposition: Home with neighbor ( lives alone ,will be coming to stay a night or two) Planned DVT Prophylaxis: aspirin 81mg  BID DME needed: walker PCP: Dr. Francesco Sor, (will be sending clearance, saw him on Friday) TXA: IV Allergies: latex - itching, codeine - hives, anaphylaxis, nickel -  skin irritation , sulfa - anaphylaxis Anesthesia Concerns: none BMI: 31.1 Last HgbA1c: Not diabetic  Other: ** NICKEL ALLERGY -- BIOMET PERSONA ** - Very sensitive skin >> talked about aquacel and she is okay with it - hydromorphone (had long discussion about her rxn to codeine - she cannot recall what med did this but has taken some medicines before, will monitor closely) , robaxin, tylenol, celebrex - staying overnight   Patient's anticipated LOS is less than 2 midnights, meeting these requirements: - Younger than 49 - Lives within 1 hour of care - Has a competent adult at home to recover with post-op recover - NO history of  - Chronic pain requiring opiods  - Diabetes  - Coronary Artery Disease  - Heart failure  - Heart attack  - Stroke  - DVT/VTE  - Cardiac arrhythmia  - Respiratory Failure/COPD  - Renal failure  - Anemia  - Advanced Liver disease  Costella Hatcher, PA-C Orthopedic Surgery [facility] [place] [phone]

## 2021-12-23 NOTE — Anesthesia Postprocedure Evaluation (Signed)
Anesthesia Post Note  Patient: Cheryl Schmitt  Procedure(s) Performed: TOTAL KNEE ARTHROPLASTY (Left: Knee)     Patient location during evaluation: PACU Anesthesia Type: Spinal Level of consciousness: awake and alert Pain management: pain level controlled Vital Signs Assessment: post-procedure vital signs reviewed and stable Respiratory status: spontaneous breathing Cardiovascular status: stable Anesthetic complications: no   No notable events documented.  Last Vitals:  Vitals:   12/23/21 1524 12/23/21 1721  BP: 124/73 109/65  Pulse: 74 72  Resp: 20 18  Temp: 36.7 C 36.7 C  SpO2: 95% 98%    Last Pain:  Vitals:   12/23/21 1626  TempSrc:   PainSc: 4                  Lewie Loron

## 2021-12-23 NOTE — Plan of Care (Signed)
?  Problem: Education: ?Goal: Knowledge of General Education information will improve ?Description: Including pain rating scale, medication(s)/side effects and non-pharmacologic comfort measures ?Outcome: Progressing ?  ?Problem: Activity: ?Goal: Risk for activity intolerance will decrease ?Outcome: Progressing ?  ?Problem: Nutrition: ?Goal: Adequate nutrition will be maintained ?Outcome: Progressing ?  ?Problem: Elimination: ?Goal: Will not experience complications related to bowel motility ?Outcome: Progressing ?  ?Problem: Pain Managment: ?Goal: General experience of comfort will improve ?Outcome: Progressing ?  ?Problem: Safety: ?Goal: Ability to remain free from injury will improve ?Outcome: Progressing ?  ?Problem: Education: ?Goal: Knowledge of the prescribed therapeutic regimen will improve ?Outcome: Progressing ?  ?Problem: Activity: ?Goal: Ability to avoid complications of mobility impairment will improve ?Outcome: Progressing ?  ?Problem: Pain Management: ?Goal: Pain level will decrease with appropriate interventions ?Outcome: Progressing ?  ?

## 2021-12-23 NOTE — Progress Notes (Signed)
AssistedDr. Germeroth with left, ultrasound guided, adductor canal block. Side rails up, monitors on throughout procedure. See vital signs in flow sheet. Tolerated Procedure well.  

## 2021-12-23 NOTE — Transfer of Care (Signed)
Immediate Anesthesia Transfer of Care Note  Patient: [name]  Procedure(s) Performed: TOTAL KNEE ARTHROPLASTY (Left: Knee)  Patient Location: PACU  Anesthesia Type:Regional and Spinal  Level of Consciousness: awake, alert  and oriented  Airway & Oxygen Therapy: Patient Spontanous Breathing and Patient connected to face mask oxygen  Post-op Assessment: Report given to RN and Post -op Vital signs reviewed and stable  Post vital signs: Reviewed and stable  Last Vitals:  Vitals Value Taken Time  BP 111/75 12/23/21 1400  Temp 36.7 C 12/23/21 1245  Pulse 69 12/23/21 1435  Resp 12 12/23/21 1435  SpO2 96 % 12/23/21 1435  Vitals shown include unvalidated device data.  Last Pain:  Vitals:   12/23/21 1245  TempSrc:   PainSc: 0-No pain      Patients Stated Pain Goal: 5 (12/23/21 0759)  Complications: No notable events documented.

## 2021-12-23 NOTE — Evaluation (Signed)
Physical Therapy Evaluation Patient Details Name: Cheryl Schmitt MRN: [id number] DOB: August 16, 1951 Today's Date: 12/23/2021  History of Present Illness  Cheryl Schmitt is a 71 y.o. female has a history of pain and functional disability in the left knee, s/p L TKA 12/23/21. PMH: anxiety, arthritis, GERD  Clinical Impression  Pt is s/p L TKA resulting in the deficits listed below (see PT Problem List). Pt from home alone, lives in townhome with 4-5 steps to enter and bil handrail, bed/bath on 2nd level with R handrail to ascend, pt reports plan to sleep in lift chair on main level with half bath initially. Pt requiring min guard-assist with mobility, ambulates 20 ft with RW, knee AROM ~ -5 to 90 degrees, decreased LLE weightbearing with ambulation. Pt tolerates remaining up in chair at EOS with RN in room; pt requesting pain medication. Recommend RW and post-acute PT to progress back to baseline at discharge. Pt will benefit from skilled PT to increase their independence and safety with mobility to allow discharge to the venue listed below.         Recommendations for follow up therapy are one component of a multi-disciplinary discharge planning process, led by the attending physician.  Recommendations may be updated based on patient status, additional functional criteria and insurance authorization.  Follow Up Recommendations Follow physician's recommendations for discharge plan and follow up therapies    Assistance Recommended at Discharge PRN  Patient can return home with the following  A little help with walking and/or transfers;A little help with bathing/dressing/bathroom;Assistance with cooking/housework;Assist for transportation;Help with stairs or ramp for entrance    Equipment Recommendations Rolling walker (2 wheels)  Recommendations for Other Services       Functional Status Assessment Patient has had a recent decline in their functional status and demonstrates the ability to make  significant improvements in function in a reasonable and predictable amount of time.     Precautions / Restrictions Precautions Precautions: Fall Restrictions Weight Bearing Restrictions: No      Mobility  Bed Mobility Overal bed mobility: Needs Assistance Bed Mobility: Supine to Sit  Supine to sit: Min guard  General bed mobility comments: increased time, self assists LLE over to EOB, use of bedrail to scoot out to EOB    Transfers Overall transfer level: Needs assistance Equipment used: Rolling walker (2 wheels) Transfers: Sit to/from Stand Sit to Stand: Min assist  General transfer comment: steadying assist to power to stand, LLE slightly extended for comfort, reliant on UE assist to power up    Ambulation/Gait Ambulation/Gait assistance: Min guard Gait Distance (Feet): 20 Feet Assistive device: Rolling walker (2 wheels) Gait Pattern/deviations: Step-to pattern, Decreased stance time - left, Decreased weight shift to left Gait velocity: decreased  General Gait Details: step to pattern with decreased LLE WB and stance time, able to clear each foot from floor, increased UE use on RW, no knee buckling noted  Stairs            Wheelchair Mobility    Modified Rankin (Stroke Patients Only)       Balance Overall balance assessment: Needs assistance Sitting-balance support: Feet supported Sitting balance-Leahy Scale: Good  Standing balance support: During functional activity, Bilateral upper extremity supported, Reliant on assistive device for balance Standing balance-Leahy Scale: Poor       Pertinent Vitals/Pain Pain Assessment Pain Assessment: 0-10 Pain Score: 4  Pain Location: L knee Pain Descriptors / Indicators: Aching, Sore Pain Intervention(s): Limited activity within patient's tolerance, Monitored during session, Repositioned  Home Living Family/patient expects to be discharged to:: Private residence Living Arrangements: Alone Available Help at  Discharge: Family;Friend(s);Available PRN/intermittently Type of Home: Other(Comment) (townhouse) Home Access: Stairs to enter Entrance Stairs-Rails: Right;Left;Can reach both Entrance Stairs-Number of Steps: 4-5 Alternate Level Stairs-Number of Steps: flight Home Layout: Two level;Bed/bath upstairs Home Equipment: Shower seat;Cane - single point      Prior Function Prior Level of Function : Independent/Modified Independent;Driving  Mobility Comments: pt reports ind with community ambulation with spc initially in morning then progresses away to nothing, 1 fall in last 6 months with multiple "near falls" ADLs Comments: pt reports ind with ADLs/IADLs     Hand Dominance        Extremity/Trunk Assessment   Upper Extremity Assessment Upper Extremity Assessment: Overall WFL for tasks assessed    Lower Extremity Assessment Lower Extremity Assessment: LLE deficits/detail LLE Deficits / Details: ankle AROM WNL, active knee quad set, able to perform SLR ~2 inches off of bed without lag, decreased sensation over incision/ace wrap, knee ~ -5 to 90 deg flexion LLE Sensation: decreased light touch    Cervical / Trunk Assessment Cervical / Trunk Assessment: Normal  Communication   Communication: No difficulties  Cognition Arousal/Alertness: Awake/alert Behavior During Therapy: WFL for tasks assessed/performed Overall Cognitive Status: Within Functional Limits for tasks assessed     General Comments General comments (skin integrity, edema, etc.): pt on RA with SpO2 92-96%    Exercises Total Joint Exercises Ankle Circles/Pumps: Supine, AROM, Both, 5 reps Quad Sets: Supine, AROM, Strengthening, Left, 5 reps   Assessment/Plan    PT Assessment Patient needs continued PT services  PT Problem List Decreased strength;Decreased range of motion;Decreased activity tolerance;Decreased balance;Decreased mobility;Decreased knowledge of use of DME;Pain       PT Treatment Interventions DME  instruction;Gait training;Stair training;Functional mobility training;Therapeutic activities;Therapeutic exercise;Balance training;Patient/family education    PT Goals (Current goals can be found in the Care Plan section)  Acute Rehab PT Goals Patient Stated Goal: home with sister to assist PT Goal Formulation: With patient Time For Goal Achievement: 01/06/22 Potential to Achieve Goals: Good    Frequency 7X/week     Co-evaluation               AM-PAC PT "6 Clicks" Mobility  Outcome Measure Help needed turning from your back to your side while in a flat bed without using bedrails?: A Little Help needed moving from lying on your back to sitting on the side of a flat bed without using bedrails?: A Little Help needed moving to and from a bed to a chair (including a wheelchair)?: A Little Help needed standing up from a chair using your arms (e.g., wheelchair or bedside chair)?: A Little Help needed to walk in hospital room?: A Little Help needed climbing 3-5 steps with a railing? : A Lot 6 Click Score: 17    End of Session Equipment Utilized During Treatment: Gait belt Activity Tolerance: Patient tolerated treatment well Patient left: in chair;with call bell/phone within reach;with chair alarm set;with nursing/sitter in room Nurse Communication: Mobility status;Patient requests pain meds PT Visit Diagnosis: Other abnormalities of gait and mobility (R26.89);Difficulty in walking, not elsewhere classified (R26.2);Pain Pain - Right/Left: Left Pain - part of body: Knee    Time: 8242-3536 PT Time Calculation (min) (ACUTE ONLY): 28 min   Charges:   PT Evaluation $PT Eval Low Complexity: 1 Low PT Treatments $Gait Training: 8-22 mins         Tori Daleen Steinhaus PT, DPT 12/23/21, 4:29  PM

## 2021-12-24 DIAGNOSIS — F419 Anxiety disorder, unspecified: Secondary | ICD-10-CM | POA: Diagnosis not present

## 2021-12-24 DIAGNOSIS — E669 Obesity, unspecified: Secondary | ICD-10-CM | POA: Diagnosis not present

## 2021-12-24 DIAGNOSIS — Z20822 Contact with and (suspected) exposure to covid-19: Secondary | ICD-10-CM | POA: Diagnosis not present

## 2021-12-24 DIAGNOSIS — G4730 Sleep apnea, unspecified: Secondary | ICD-10-CM | POA: Diagnosis not present

## 2021-12-24 DIAGNOSIS — Z91040 Latex allergy status: Secondary | ICD-10-CM | POA: Diagnosis not present

## 2021-12-24 DIAGNOSIS — M1712 Unilateral primary osteoarthritis, left knee: Secondary | ICD-10-CM | POA: Diagnosis not present

## 2021-12-24 DIAGNOSIS — K219 Gastro-esophageal reflux disease without esophagitis: Secondary | ICD-10-CM | POA: Diagnosis not present

## 2021-12-24 LAB — CBC
HCT: 32.8 % — ABNORMAL LOW (ref 36.0–46.0)
Hemoglobin: 10.6 g/dL — ABNORMAL LOW (ref 12.0–15.0)
MCH: 29 pg (ref 26.0–34.0)
MCHC: 32.3 g/dL (ref 30.0–36.0)
MCV: 89.9 fL (ref 80.0–100.0)
Platelets: 214 10*3/uL (ref 150–400)
RBC: 3.65 MIL/uL — ABNORMAL LOW (ref 3.87–5.11)
RDW: 13.3 % (ref 11.5–15.5)
WBC: 13.1 10*3/uL — ABNORMAL HIGH (ref 4.0–10.5)
nRBC: 0 % (ref 0.0–0.2)

## 2021-12-24 LAB — BASIC METABOLIC PANEL
Anion gap: 7 (ref 5–15)
BUN: 18 mg/dL (ref 8–23)
CO2: 24 mmol/L (ref 22–32)
Calcium: 8.3 mg/dL — ABNORMAL LOW (ref 8.9–10.3)
Chloride: 105 mmol/L (ref 98–111)
Creatinine, Ser: 0.67 mg/dL (ref 0.44–1.00)
GFR, Estimated: >60 mL/min (ref >60)
Glucose, Bld: 136 mg/dL — ABNORMAL HIGH (ref 70–99)
Potassium: 3.9 mmol/L (ref 3.5–5.1)
Sodium: 136 mmol/L (ref 135–145)

## 2021-12-24 MED ORDER — HYDROMORPHONE HCL 2 MG PO TABS: 2.0000 mg | ORAL_TABLET | ORAL | 0 refills | Status: AC | PRN

## 2021-12-24 MED ORDER — POLYETHYLENE GLYCOL 3350 17 G PO PACK: 17.0000 g | PACK | Freq: Every day | ORAL | 0 refills | Status: AC | PRN

## 2021-12-24 MED ORDER — ASPIRIN 81 MG PO CHEW: 81.0000 mg | CHEWABLE_TABLET | Freq: Two times a day (BID) | ORAL | 0 refills | Status: AC

## 2021-12-24 MED ORDER — DOCUSATE SODIUM 100 MG PO CAPS: 100.0000 mg | ORAL_CAPSULE | Freq: Two times a day (BID) | ORAL | 0 refills | Status: AC

## 2021-12-24 NOTE — Progress Notes (Signed)
Physical Therapy Treatment Patient Details Name: Cheryl Schmitt MRN: [id number] DOB: [birth year] Today's Date: 12/24/2021   History of Present Illness Cheryl Schmitt is a 71 y.o. female has a history of pain and functional disability in the left knee, s/p L TKA 12/23/21. PMH: anxiety, arthritis, GERD    PT Comments    Pt reports little pain initially, once weight-bearing pt c/o 7/10 pain that is consistent while ambulating. Pt taking short step to steps, decreased LLE WB and stance time, decreased L knee flexion in swing. Pt unable to navigate stairs due to pain level, returned to room and pt self assisted LLE back into supine. Pt tolerates ankle pumps and quad sets without increasing pain, reapplied ice. Pt has 5 steps to enter her 2 story townhome with her bedroom on the second level, pt only able to tolerate 2 steps with min A in morning session; pt is a high fall risk and unsafe to d/c home with sister assisting at this time. Educated pt on safety with mobility and pt in agreement that she is not safe to d/c home at this time. Notified RN of pt's pain level at EOS and pt's hesitancy to take pain medication, wants to know her options. Will continue therapy tomorrow with hopes to d/c home with sister to assist.   Recommendations for follow up therapy are one component of a multi-disciplinary discharge planning process, led by the attending physician.  Recommendations may be updated based on patient status, additional functional criteria and insurance authorization.  Follow Up Recommendations  Follow physician's recommendations for discharge plan and follow up therapies     Assistance Recommended at Discharge PRN  Patient can return home with the following A little help with walking and/or transfers;A little help with bathing/dressing/bathroom;Assistance with cooking/housework;Assist for transportation;Help with stairs or ramp for entrance   Equipment Recommendations  Rolling walker (2 wheels)     Recommendations for Other Services       Precautions / Restrictions Precautions Precautions: Fall Restrictions Weight Bearing Restrictions: No     Mobility  Bed Mobility Overal bed mobility: Needs Assistance Bed Mobility: Supine to Sit, Sit to Supine  Supine to sit: Min guard Sit to supine: Min guard  General bed mobility comments: increased time, BUE assisting LLE with bed mobility    Transfers Overall transfer level: Needs assistance Equipment used: Rolling walker (2 wheels) Transfers: Sit to/from Stand Sit to Stand: Min guard  General transfer comment: increased time, reliant on BUE assisting, LLE elevated off of floor with initial STS    Ambulation/Gait Ambulation/Gait assistance: Min guard Gait Distance (Feet): 80 Feet Assistive device: Rolling walker (2 wheels) Gait Pattern/deviations: Step-to pattern, Decreased stance time - left, Decreased weight shift to left Gait velocity: decreased  General Gait Details: step to pattern with decreased LLE weightbearing and stance time, decreased L knee flexion in swing   Stairs Unable this session due to pain   Wheelchair Mobility    Modified Rankin (Stroke Patients Only)       Balance Overall balance assessment: Needs assistance Sitting-balance support: Feet supported Sitting balance-Leahy Scale: Good  Standing balance support: During functional activity, Bilateral upper extremity supported, Reliant on assistive device for balance Standing balance-Leahy Scale: Poor     Cognition Arousal/Alertness: Awake/alert Behavior During Therapy: WFL for tasks assessed/performed Overall Cognitive Status: Within Functional Limits for tasks assessed     Exercises Total Joint Exercises Ankle Circles/Pumps: Supine, AROM, Both, 20 reps Quad Sets: Supine, AROM, Strengthening, Both, 10 reps  General Comments        Pertinent Vitals/Pain Pain Assessment Pain Assessment: 0-10 Pain Score: 7  Pain Location: L knee Pain  Descriptors / Indicators: Aching, Sore Pain Intervention(s): Limited activity within patient's tolerance, Monitored during session, Repositioned, Ice applied    Home Living                          Prior Function            PT Goals (current goals can now be found in the care plan section) Acute Rehab PT Goals Patient Stated Goal: home with sister to assist PT Goal Formulation: With patient Time For Goal Achievement: 01/06/22 Potential to Achieve Goals: Good Progress towards PT goals: Progressing toward goals    Frequency    7X/week      PT Plan Current plan remains appropriate    Co-evaluation              AM-PAC PT "6 Clicks" Mobility   Outcome Measure  Help needed turning from your back to your side while in a flat bed without using bedrails?: A Little Help needed moving from lying on your back to sitting on the side of a flat bed without using bedrails?: A Little Help needed moving to and from a bed to a chair (including a wheelchair)?: A Little Help needed standing up from a chair using your arms (e.g., wheelchair or bedside chair)?: A Little Help needed to walk in hospital room?: A Little Help needed climbing 3-5 steps with a railing? : A Lot 6 Click Score: 17    End of Session Equipment Utilized During Treatment: Gait belt Activity Tolerance: Patient tolerated treatment well;Patient limited by pain Patient left: in bed;with bed alarm set;with nursing/sitter in room Nurse Communication: Mobility status;Other (comment) (pain level) PT Visit Diagnosis: Other abnormalities of gait and mobility (R26.89);Difficulty in walking, not elsewhere classified (R26.2);Pain Pain - Right/Left: Left Pain - part of body: Knee     Time: 1316-1340 PT Time Calculation (min) (ACUTE ONLY): 24 min  Charges:  $Gait Training: 8-22 mins $Therapeutic Exercise: 8-22 mins                      Tori Derrel Moore PT, DPT 12/24/21, 1:47 PM

## 2021-12-24 NOTE — Progress Notes (Signed)
Physical Therapy Treatment Patient Details Name: Cheryl Schmitt MRN: [id number] DOB: July 27, 1951 Today's Date: 12/24/2021   History of Present Illness Cheryl Schmitt is a 71 y.o. female has a history of pain and functional disability in the left knee, s/p L TKA 12/23/21. PMH: anxiety, arthritis, GERD    PT Comments    Pt ambulates 80 ft in hallway with RW, slow but steady step to pattern, decreased LLE weight-bearing. Pt ascends 2 steps with bil handrail, step to pattern, requiring min A to power up and steady, min guard for descent, fatigues with stair training and unable to perform additional steps. Pt with 5 STE home and bil handrail and flight of stairs up to bedroom, pt is unsafe to d/c home at this time. Plan for 2nd session this afternoon for stair training, gait training, HEP education and safety with mobility.   Recommendations for follow up therapy are one component of a multi-disciplinary discharge planning process, led by the attending physician.  Recommendations may be updated based on patient status, additional functional criteria and insurance authorization.  Follow Up Recommendations  Follow physician's recommendations for discharge plan and follow up therapies     Assistance Recommended at Discharge PRN  Patient can return home with the following A little help with walking and/or transfers;A little help with bathing/dressing/bathroom;Assistance with cooking/housework;Assist for transportation;Help with stairs or ramp for entrance   Equipment Recommendations  Rolling walker (2 wheels)    Recommendations for Other Services       Precautions / Restrictions Precautions Precautions: Fall Restrictions Weight Bearing Restrictions: No     Mobility  Bed Mobility  General bed mobility comments: in recliner upon arrival    Transfers Overall transfer level: Needs assistance Equipment used: Rolling walker (2 wheels) Transfers: Sit to/from Stand Sit to Stand: Min guard  General  transfer comment: pt powers to stand with BUE assisting, LLE slightly extended while rising and lowering for comfort    Ambulation/Gait Ambulation/Gait assistance: Min guard Gait Distance (Feet): 80 Feet Assistive device: Rolling walker (2 wheels) Gait Pattern/deviations: Step-to pattern, Decreased stance time - left, Decreased weight shift to left Gait velocity: decreased  General Gait Details: step to pattern with decreased LLE weightbearing and stance time, no knee buckling noted, decreased L knee flexion in swing   Stairs Stairs: Yes Stairs assistance: Min assist Stair Management: Two rails, Step to pattern Number of Stairs: 2 General stair comments: step to pattern, slightly unsteady requiring min A to power up steps and min guard for controlled lowering, increased time and cues, fatigues and unable to perform additional steps   Wheelchair Mobility    Modified Rankin (Stroke Patients Only)       Balance Overall balance assessment: Needs assistance  Standing balance support: During functional activity, Bilateral upper extremity supported, Reliant on assistive device for balance Standing balance-Leahy Scale: Poor     Cognition Arousal/Alertness: Awake/alert Behavior During Therapy: WFL for tasks assessed/performed Overall Cognitive Status: Within Functional Limits for tasks assessed     Exercises      General Comments        Pertinent Vitals/Pain Pain Assessment Pain Assessment: 0-10 Pain Score: 5  Pain Location: L knee Pain Descriptors / Indicators: Aching, Sore Pain Intervention(s): Limited activity within patient's tolerance, Monitored during session, Repositioned, Ice applied    Home Living                          Prior Function  PT Goals (current goals can now be found in the care plan section) Acute Rehab PT Goals Patient Stated Goal: home with sister to assist PT Goal Formulation: With patient Time For Goal Achievement:  01/06/22 Potential to Achieve Goals: Good Progress towards PT goals: Progressing toward goals    Frequency    7X/week      PT Plan Current plan remains appropriate    Co-evaluation              AM-PAC PT "6 Clicks" Mobility   Outcome Measure  Help needed turning from your back to your side while in a flat bed without using bedrails?: A Little Help needed moving from lying on your back to sitting on the side of a flat bed without using bedrails?: A Little Help needed moving to and from a bed to a chair (including a wheelchair)?: A Little Help needed standing up from a chair using your arms (e.g., wheelchair or bedside chair)?: A Little Help needed to walk in hospital room?: A Little Help needed climbing 3-5 steps with a railing? : A Lot 6 Click Score: 17    End of Session Equipment Utilized During Treatment: Gait belt Activity Tolerance: Patient tolerated treatment well Patient left: in chair;with call bell/phone within reach;with chair alarm set;with nursing/sitter in room Nurse Communication: Mobility status PT Visit Diagnosis: Other abnormalities of gait and mobility (R26.89);Difficulty in walking, not elsewhere classified (R26.2);Pain Pain - Right/Left: Left Pain - part of body: Knee     Time: 1012-1036 PT Time Calculation (min) (ACUTE ONLY): 24 min  Charges:  $Gait Training: 23-37 mins                     Tori Camauri Craton PT, DPT 12/24/21, 12:10 PM

## 2021-12-24 NOTE — TOC Transition Note (Signed)
Transition of Care [facility]) - CM/SW Discharge Note   Patient Details  Name: Cheryl Schmitt MRN: [id number] Date of Birth: 1951-06-21  [facility] of [facility] CM/SW Contact:  Lennart Pall, LCSW Phone Number: 12/24/2021, 10:57 AM   Clinical Narrative:    Met briefly with pt and confiming receipt of rw via [place].  Plan for OPPT at [facility].  NO TOC needs.   Final next level of care: OP Rehab Barriers to Discharge: No Barriers Identified   Patient Goals and CMS Choice Patient states their goals for this hospitalization and ongoing recovery are:: return home      Discharge Placement                       Discharge Plan and Services                DME Arranged: Walker rolling DME Agency: [place] [facility]                  Social Determinants of Health (SDOH) Interventions     Readmission Risk Interventions No flowsheet data found.

## 2021-12-24 NOTE — Progress Notes (Signed)
° °  Subjective: 1 Day Post-Op Procedure(s) (LRB): TOTAL KNEE ARTHROPLASTY (Left) Patient reports pain as moderate.   Patient seen in rounds for Dr. Charlann Boxer. Patient is well, and has had no acute complaints or problems. No acute events overnight. Foley catheter removed. Patient ambulated 20 feet with PT. Patient reports she has tolerated the pain medication so far without signs of allergy. We will continue therapy today.   Objective: Vital signs in last 24 hours: Temp:  [97.4 F (36.3 C)-98.2 F (36.8 C)] 97.7 F (36.5 C) (02/08 0921) Pulse Rate:  [66-79] 79 (02/08 0921) Resp:  [9-20] 18 (02/08 0921) BP: (92-126)/(56-75) 106/58 (02/08 0921) SpO2:  [92 %-98 %] 97 % (02/08 0921) Weight:  [83 kg] 83 kg (02/07 1524)  Intake/Output from previous day:  Intake/Output Summary (Last 24 hours) at 12/24/2021 1224 Last data filed at 12/24/2021 1024 Gross per 24 hour  Intake 3448.75 ml  Output 2950 ml  Net 498.75 ml     Intake/Output this shift: Total I/O In: 207.5 [I.V.:207.5] Out: 750 [Urine:750]  Labs: Recent Labs    12/24/21 0310  HGB 10.6*   Recent Labs    12/24/21 0310  WBC 13.1*  RBC 3.65*  HCT 32.8*  PLT 214   Recent Labs    12/24/21 0310  NA 136  K 3.9  CL 105  CO2 24  BUN 18  CREATININE 0.67  GLUCOSE 136*  CALCIUM 8.3*   No results for input(s): LABPT, INR in the last 72 hours.  Exam: General - Patient is Alert and Oriented Extremity - Neurologically intact Sensation intact distally Intact pulses distally Dorsiflexion/Plantar flexion intact Dressing - dressing C/D/I Motor Function - intact, moving foot and toes well on exam.   Past Medical History:  Diagnosis Date   Anemia    Anxiety    Arthritis    Complication of anesthesia    extesnvie shaking   Depression    GERD (gastroesophageal reflux disease)    Headache    PONV (postoperative nausea and vomiting)    Sleep apnea    no cpap    Assessment/Plan: 1 Day Post-Op Procedure(s) (LRB): TOTAL  KNEE ARTHROPLASTY (Left) Principal Problem:   S/P total knee arthroplasty, left  Estimated body mass index is 30.45 kg/m as calculated from the following:   Height as of this encounter: 5\' 5"  (1.651 m).   Weight as of this encounter: 83 kg. Advance diet Up with therapy D/C IV fluids   Patient's anticipated LOS is less than 2 midnights, meeting these requirements: - Younger than 19 - Lives within 1 hour of care - Has a competent adult at home to recover with post-op recover - NO history of  - Chronic pain requiring opiods  - Diabetes  - Coronary Artery Disease  - Heart failure  - Heart attack  - Stroke  - DVT/VTE  - Cardiac arrhythmia  - Respiratory Failure/COPD  - Renal failure  - Anemia  - Advanced Liver disease     DVT Prophylaxis - Aspirin Weight bearing as tolerated.  Hgb stable at 10.6 this AM.   Plan is to go Home after hospital stay. Plan for discharge today following 1-2 sessions of PT as long as they are meeting their goals. Patient is scheduled for OPPT. Follow up in the office in 2 weeks.   74, PA-C Orthopedic Surgery [phone] 12/24/2021, 12:24 PM

## 2021-12-25 ENCOUNTER — Encounter (HOSPITAL_COMMUNITY): Payer: Self-pay | Admitting: Orthopedic Surgery

## 2021-12-25 DIAGNOSIS — G4730 Sleep apnea, unspecified: Secondary | ICD-10-CM | POA: Diagnosis not present

## 2021-12-25 DIAGNOSIS — K219 Gastro-esophageal reflux disease without esophagitis: Secondary | ICD-10-CM | POA: Diagnosis not present

## 2021-12-25 DIAGNOSIS — E669 Obesity, unspecified: Secondary | ICD-10-CM | POA: Diagnosis not present

## 2021-12-25 DIAGNOSIS — F419 Anxiety disorder, unspecified: Secondary | ICD-10-CM | POA: Diagnosis not present

## 2021-12-25 DIAGNOSIS — Z91040 Latex allergy status: Secondary | ICD-10-CM | POA: Diagnosis not present

## 2021-12-25 DIAGNOSIS — Z20822 Contact with and (suspected) exposure to covid-19: Secondary | ICD-10-CM | POA: Diagnosis not present

## 2021-12-25 DIAGNOSIS — M1712 Unilateral primary osteoarthritis, left knee: Secondary | ICD-10-CM | POA: Diagnosis not present

## 2021-12-25 LAB — CBC
HCT: 30.6 % — ABNORMAL LOW (ref 36.0–46.0)
Hemoglobin: 10 g/dL — ABNORMAL LOW (ref 12.0–15.0)
MCH: 29.5 pg (ref 26.0–34.0)
MCHC: 32.7 g/dL (ref 30.0–36.0)
MCV: 90.3 fL (ref 80.0–100.0)
Platelets: 191 10*3/uL (ref 150–400)
RBC: 3.39 MIL/uL — ABNORMAL LOW (ref 3.87–5.11)
RDW: 13.7 % (ref 11.5–15.5)
WBC: 13.1 10*3/uL — ABNORMAL HIGH (ref 4.0–10.5)
nRBC: 0 % (ref 0.0–0.2)

## 2021-12-25 MED ORDER — METHOCARBAMOL 500 MG PO TABS
500.0000 mg | ORAL_TABLET | Freq: Four times a day (QID) | ORAL | Status: DC | PRN
  Administered 2021-12-25: 500 mg via ORAL
  Filled 2021-12-25: qty 1

## 2021-12-25 MED ORDER — METHOCARBAMOL 500 MG PO TABS: 500.0000 mg | ORAL_TABLET | Freq: Four times a day (QID) | ORAL | 0 refills | Status: AC | PRN

## 2021-12-25 MED ORDER — KETOROLAC TROMETHAMINE 15 MG/ML IJ SOLN
7.5000 mg | Freq: Four times a day (QID) | INTRAMUSCULAR | Status: AC
  Administered 2021-12-25 (×2): 7.5 mg via INTRAVENOUS
  Filled 2021-12-25 (×2): qty 1

## 2021-12-25 NOTE — Progress Notes (Signed)
Physical Therapy Treatment Patient Details Name: Cheryl Schmitt MRN: [id number] DOB: 02-Dec-1950 Today's Date: 12/25/2021   History of Present Illness Cheryl Schmitt is a 71 y.o. female has a history of pain and functional disability in the left knee, s/p L TKA 12/23/21. PMH: anxiety, arthritis, GERD    PT Comments    Pt reports feeling better since receiving muscle relaxer earlier. Pt ambulated in hallway and practiced steps.  Pt would like afternoon session to practice stairs again prior to d/c home today.   Recommendations for follow up therapy are one component of a multi-disciplinary discharge planning process, led by the attending physician.  Recommendations may be updated based on patient status, additional functional criteria and insurance authorization.  Follow Up Recommendations  Follow physician's recommendations for discharge plan and follow up therapies     Assistance Recommended at Discharge PRN  Patient can return home with the following A little help with walking and/or transfers;A little help with bathing/dressing/bathroom;Assistance with cooking/housework;Assist for transportation;Help with stairs or ramp for entrance   Equipment Recommendations  Rolling walker (2 wheels)    Recommendations for Other Services       Precautions / Restrictions Precautions Precautions: Fall;Knee Restrictions Weight Bearing Restrictions: No     Mobility  Bed Mobility               General bed mobility comments: pt in recliner    Transfers Overall transfer level: Needs assistance Equipment used: Rolling walker (2 wheels) Transfers: Sit to/from Stand Sit to Stand: Min guard           General transfer comment: increased time, reliant on BUE assisting, cues for UE and LE positioning for pain control    Ambulation/Gait Ambulation/Gait assistance: Min guard Gait Distance (Feet): 60 Feet Assistive device: Rolling walker (2 wheels) Gait Pattern/deviations: Step-to pattern,  Decreased stance time - left, Antalgic Gait velocity: decreased     General Gait Details: verbal cues for sequence, RW positioning, posture   Stairs Stairs: Yes Stairs assistance: Min guard, Min assist Stair Management: One rail Left, Forwards, With cane, Step to pattern Number of Stairs: 2 General stair comments: pt reports now that she doesn't believe she can reach both rails to enter home so practiced with rail and SPC; pt performed x3 per request   Wheelchair Mobility    Modified Rankin (Stroke Patients Only)       Balance                                            Cognition Arousal/Alertness: Awake/alert Behavior During Therapy: WFL for tasks assessed/performed Overall Cognitive Status: Within Functional Limits for tasks assessed                                          Exercises      General Comments        Pertinent Vitals/Pain Pain Assessment Pain Assessment: 0-10 Pain Score: 6  Pain Location: L knee Pain Descriptors / Indicators: Aching, Sore Pain Intervention(s): Repositioned, Premedicated before session, Monitored during session    Home Living                          Prior Function  PT Goals (current goals can now be found in the care plan section) Progress towards PT goals: Progressing toward goals    Frequency    7X/week      PT Plan Current plan remains appropriate    Co-evaluation              AM-PAC PT "6 Clicks" Mobility   Outcome Measure  Help needed turning from your back to your side while in a flat bed without using bedrails?: A Little Help needed moving from lying on your back to sitting on the side of a flat bed without using bedrails?: A Little Help needed moving to and from a bed to a chair (including a wheelchair)?: A Little Help needed standing up from a chair using your arms (e.g., wheelchair or bedside chair)?: A Little Help needed to walk in hospital  room?: A Little Help needed climbing 3-5 steps with a railing? : A Little 6 Click Score: 18    End of Session Equipment Utilized During Treatment: Gait belt Activity Tolerance: Patient tolerated treatment well Patient left: in chair;with call bell/phone within reach;with chair alarm set Nurse Communication: Mobility status PT Visit Diagnosis: Other abnormalities of gait and mobility (R26.89);Difficulty in walking, not elsewhere classified (R26.2)     Time: 1002-1020 PT Time Calculation (min) (ACUTE ONLY): 18 min  Charges:  $Gait Training: 8-22 mins                     Thomasene Mohair PT, DPT Acute Rehabilitation Services Pager: [phone] Office: [phone]    Cheryl Schmitt 12/25/2021, 1:29 PM

## 2021-12-25 NOTE — Progress Notes (Signed)
Physical Therapy Treatment Patient Details Name: Cheryl Schmitt MRN: [id number] DOB: May 07, 1951 Today's Date: 12/25/2021   History of Present Illness Cheryl Schmitt is a 71 y.o. female has a history of pain and functional disability in the left knee, s/p L TKA 12/23/21. PMH: anxiety, arthritis, GERD    PT Comments    Pt ambulated in hallway and reports pain better this afternoon.  Pt practiced safe stair technique again with sister observing this time.  Pt reports understanding and feels comfortable with d/c home today.  Pt's questions answered within scope of practice and verbally reviewed exercises (pt wished to save energy for d/c home and reports understanding of exercises).  Pt plans to f/u with OPPT on Monday.    Recommendations for follow up therapy are one component of a multi-disciplinary discharge planning process, led by the attending physician.  Recommendations may be updated based on patient status, additional functional criteria and insurance authorization.  Follow Up Recommendations  Follow physician's recommendations for discharge plan and follow up therapies     Assistance Recommended at Discharge PRN  Patient can return home with the following A little help with walking and/or transfers;A little help with bathing/dressing/bathroom;Assistance with cooking/housework;Assist for transportation;Help with stairs or ramp for entrance   Equipment Recommendations  Rolling walker (2 wheels)    Recommendations for Other Services       Precautions / Restrictions Precautions Precautions: Fall;Knee Restrictions Weight Bearing Restrictions: No     Mobility  Bed Mobility               General bed mobility comments: pt in recliner    Transfers Overall transfer level: Needs assistance Equipment used: Rolling walker (2 wheels) Transfers: Sit to/from Stand Sit to Stand: Min guard           General transfer comment: increased time, reliant on BUE assisting, cues for UE  and LE positioning for pain control    Ambulation/Gait Ambulation/Gait assistance: Min guard Gait Distance (Feet): 60 Feet Assistive device: Rolling walker (2 wheels) Gait Pattern/deviations: Step-to pattern, Decreased stance time - left, Antalgic Gait velocity: decreased     General Gait Details: verbal cues for sequence, RW positioning, posture   Stairs Stairs: Yes Stairs assistance: Min guard Stair Management: One rail Left, Forwards, With cane, Step to pattern Number of Stairs: 2 General stair comments: verbal cues for sequencing and safety, pt's sister observed; pt performed x3 (has 5 steps to enter)   Wheelchair Mobility    Modified Rankin (Stroke Patients Only)       Balance                                            Cognition Arousal/Alertness: Awake/alert Behavior During Therapy: WFL for tasks assessed/performed Overall Cognitive Status: Within Functional Limits for tasks assessed                                          Exercises      General Comments        Pertinent Vitals/Pain Pain Assessment Pain Assessment: 0-10 Pain Score: 5  Pain Location: L knee Pain Descriptors / Indicators: Aching, Sore Pain Intervention(s): Repositioned, Premedicated before session, Monitored during session    Home Living  Prior Function            PT Goals (current goals can now be found in the care plan section) Progress towards PT goals: Progressing toward goals    Frequency    7X/week      PT Plan Current plan remains appropriate    Co-evaluation              AM-PAC PT "6 Clicks" Mobility   Outcome Measure  Help needed turning from your back to your side while in a flat bed without using bedrails?: A Little Help needed moving from lying on your back to sitting on the side of a flat bed without using bedrails?: A Little Help needed moving to and from a bed to a chair  (including a wheelchair)?: A Little Help needed standing up from a chair using your arms (e.g., wheelchair or bedside chair)?: A Little Help needed to walk in hospital room?: A Little Help needed climbing 3-5 steps with a railing? : A Little 6 Click Score: 18    End of Session Equipment Utilized During Treatment: Gait belt Activity Tolerance: Patient tolerated treatment well Patient left: in chair;with call bell/phone within reach;with chair alarm set;with family/visitor present Nurse Communication: Mobility status PT Visit Diagnosis: Other abnormalities of gait and mobility (R26.89);Difficulty in walking, not elsewhere classified (R26.2)     Time: 3009-2330 PT Time Calculation (min) (ACUTE ONLY): 20 min  Charges:  $Gait Training: 8-22 mins                    Paulino Door, DPT Acute Rehabilitation Services Pager: [phone] Office: [phone]    Cheryl Schmitt 12/25/2021, 3:33 PM

## 2021-12-25 NOTE — Plan of Care (Signed)
Pt ready to DC home with family. 

## 2021-12-25 NOTE — Progress Notes (Signed)
° °  Subjective: 2 Days Post-Op Procedure(s) (LRB): TOTAL KNEE ARTHROPLASTY (Left) Patient reports pain as moderate.   Patient seen in rounds for Dr. Charlann Boxer. Patient is well, and has had no acute complaints or problems. No acute events overnight. Voiding without difficulty. Patient ambulated 80 feet with PT but was significantly limited by pain and unable to progress to do steps.  We will continue therapy today.   Objective: Vital signs in last 24 hours: Temp:  [98 F (36.7 C)-98.6 F (37 C)] 98.6 F (37 C) (02/09 0527) Pulse Rate:  [72-97] 97 (02/09 0527) Resp:  [16] 16 (02/09 0527) BP: (120-147)/(62-78) 147/78 (02/09 0527) SpO2:  [93 %-98 %] 93 % (02/09 0527)  Intake/Output from previous day:  Intake/Output Summary (Last 24 hours) at 12/25/2021 1116 Last data filed at 12/25/2021 0945 Gross per 24 hour  Intake 240 ml  Output --  Net 240 ml     Intake/Output this shift: Total I/O In: 240 [P.O.:240] Out: -   Labs: Recent Labs    12/24/21 0310 12/25/21 0306  HGB 10.6* 10.0*   Recent Labs    12/24/21 0310 12/25/21 0306  WBC 13.1* 13.1*  RBC 3.65* 3.39*  HCT 32.8* 30.6*  PLT 214 191   Recent Labs    12/24/21 0310  NA 136  K 3.9  CL 105  CO2 24  BUN 18  CREATININE 0.67  GLUCOSE 136*  CALCIUM 8.3*   No results for input(s): LABPT, INR in the last 72 hours.  Exam: General - Patient is Alert and Oriented Extremity - Neurologically intact Sensation intact distally Intact pulses distally Dorsiflexion/Plantar flexion intact Dressing - dressing C/D/I Motor Function - intact, moving foot and toes well on exam.   Past Medical History:  Diagnosis Date   Anemia    Anxiety    Arthritis    Complication of anesthesia    extesnvie shaking   Depression    GERD (gastroesophageal reflux disease)    Headache    PONV (postoperative nausea and vomiting)    Sleep apnea    no cpap    Assessment/Plan: 2 Days Post-Op Procedure(s) (LRB): TOTAL KNEE ARTHROPLASTY  (Left) Principal Problem:   S/P total knee arthroplasty, left  Estimated body mass index is 30.45 kg/m as calculated from the following:   Height as of this encounter: 5\' 5"  (1.651 m).   Weight as of this encounter: 83 kg. Advance diet Up with therapy D/C IV fluids   DVT Prophylaxis - Aspirin Weight bearing as tolerated.  Hgb stable at 10 this AM  I had a discussion with patient this morning about pain control. She seems very hesitant to utilize medications, which is okay but she is having pain that limits her mobility. I encouraged her to use 1-2 tablets of dilaudid as needed, as well as starting robaxin. I will give a few doses of toradol to help as well.   Plan is to go Home after hospital stay. Plan for discharge today following 1-2 sessions of PT as long as they are meeting their goals. Patient is scheduled for OPPT. Follow up in the office in 2 weeks.   , PA-C Orthopedic Surgery [phone] 12/25/2021, 11:16 AM

## 2021-12-29 DIAGNOSIS — M25562 Pain in left knee: Secondary | ICD-10-CM | POA: Diagnosis not present

## 2021-12-31 DIAGNOSIS — M25562 Pain in left knee: Secondary | ICD-10-CM | POA: Diagnosis not present

## 2022-01-01 NOTE — Discharge Summary (Signed)
Patient ID: Cheryl Schmitt MRN: [id number] DOB/AGE: Apr 10, 1951 71 y.o.  Admit date: 12/23/2021 Discharge date: 12/25/2021  Admission Diagnoses:  Left knee osteoarthritis  Discharge Diagnoses:  Principal Problem:   S/P total knee arthroplasty, left   Past Medical History:  Diagnosis Date   Anemia    Anxiety    Arthritis    Complication of anesthesia    extesnvie shaking   Depression    GERD (gastroesophageal reflux disease)    Headache    PONV (postoperative nausea and vomiting)    Sleep apnea    no cpap    Surgeries: Procedure(s): TOTAL KNEE ARTHROPLASTY on 12/23/2021   Consultants:   Discharged Condition: Improved  Hospital Course: Cheryl Schmitt is an 71 y.o. female who was admitted 12/23/2021 for operative treatment ofS/P total knee arthroplasty, left. Patient has severe unremitting pain that affects sleep, daily activities, and work/hobbies. After pre-op clearance the patient was taken to the operating room on 12/23/2021 and underwent  Procedure(s): TOTAL KNEE ARTHROPLASTY.    Patient was given perioperative antibiotics:  Anti-infectives (From admission, onward)    Start     Dose/Rate Route Frequency Ordered Stop   12/23/21 1600  ceFAZolin (ANCEF) IVPB 2g/100 mL premix        2 g 200 mL/hr over 30 Minutes Intravenous Every 6 hours 12/23/21 1526 12/23/21 2251   12/23/21 0745  ceFAZolin (ANCEF) IVPB 2g/100 mL premix        2 g 200 mL/hr over 30 Minutes Intravenous On call to O.R. 12/23/21 0731 12/23/21 1008        Patient was given sequential compression devices, early ambulation, and chemoprophylaxis to prevent DVT. Patient worked with PT and was meeting their goals regarding safe ambulation and transfers.  Patient benefited maximally from hospital stay and there were no complications.    Recent vital signs: No data found.   Recent laboratory studies: No results for input(s): WBC, HGB, HCT, PLT, NA, K, CL, CO2, BUN, CREATININE, GLUCOSE, INR, CALCIUM in the last 72  hours.  Invalid input(s): PT, 2   Discharge Medications:   Allergies as of 12/25/2021       Reactions   Anidulafungin Shortness Of Breath   Pt states she never taken this drug   Codeine Anaphylaxis   Erythromycin Anaphylaxis   Other Shortness Of Breath, Rash   Fragrance   Sulfa Antibiotics Anaphylaxis   Aspirin Other (See Comments)   Upset stomach when too much is taken   Latex Rash   Nickel Itching, Rash        Medication List     STOP taking these medications    nabumetone 750 MG tablet Commonly known as: RELAFEN   tiZANidine 2 MG tablet Commonly known as: ZANAFLEX       TAKE these medications    Armodafinil 150 MG tablet Take 75 mg by mouth daily.   aspirin 81 MG chewable tablet Chew 1 tablet (81 mg total) by mouth 2 (two) times daily for 28 days.   atorvastatin 10 MG tablet Commonly known as: LIPITOR Take 10 mg by mouth at bedtime.   Azelaic Acid 15 % gel Apply 1 application topically daily as needed (rosacea).   Biotin 98921 MCG Tabs Take 10,000 mcg by mouth daily.   buPROPion 300 MG 24 hr tablet Commonly known as: WELLBUTRIN XL Take 300 mg by mouth daily.   CALCIUM 1200 PO Take 1,200 mg by mouth daily.   carboxymethylcellulose 0.5 % Soln Commonly known as: REFRESH PLUS Place 1  drop into both eyes 3 (three) times daily as needed (dry eyes).   desonide 0.05 % cream Commonly known as: DESOWEN Apply 1 application topically daily as needed (facial breakouts).   docusate sodium 100 MG capsule Commonly known as: COLACE Take 1 capsule (100 mg total) by mouth 2 (two) times daily.   FLUoxetine 10 MG tablet Commonly known as: PROZAC Take 10 mg by mouth daily.   HYDROmorphone 2 MG tablet Commonly known as: DILAUDID Take 1-2 tablets (2-4 mg total) by mouth every 4 (four) hours as needed.   methocarbamol 500 MG tablet Commonly known as: ROBAXIN Take 1 tablet (500 mg total) by mouth every 6 (six) hours as needed for muscle spasms.    metroNIDAZOLE 0.75 % gel Commonly known as: METROGEL Apply 1 application topically daily.   multivitamin with minerals Tabs tablet Take 1 tablet by mouth daily.   polyethylene glycol 17 g packet Commonly known as: MIRALAX / GLYCOLAX Take 17 g by mouth daily as needed for mild constipation.   progesterone 100 MG capsule Commonly known as: PROMETRIUM Take 100 mg by mouth at bedtime.   senna 8.6 MG Tabs tablet Commonly known as: SENOKOT Take 4 tablets by mouth daily.   sodium chloride 0.65 % Soln nasal spray Commonly known as: OCEAN Place 1 spray into both nostrils as needed for congestion.   vitamin C 1000 MG tablet Take 1,000 mg by mouth daily.   ZYRTEC-D PO Take 1 tablet by mouth daily.               Discharge Care Instructions  (From admission, onward)           Start     Ordered   12/25/21 0000  Change dressing       Comments: Maintain surgical dressing until follow up in the clinic. If the edges start to pull up, may reinforce with tape. If the dressing is no longer working, may remove and cover with gauze and tape, but must keep the area dry and clean.  Call with any questions or concerns.   12/25/21 1422   12/24/21 0000  Change dressing       Comments: Maintain surgical dressing until follow up in the clinic. If the edges start to pull up, may reinforce with tape. If the dressing is no longer working, may remove and cover with gauze and tape, but must keep the area dry and clean.  Call with any questions or concerns.   12/24/21 1226            Diagnostic Studies: No results found.  Disposition: Discharge disposition: 01-Home or Self Care       Discharge Instructions     Call MD / Call 911   Complete by: As directed    If you experience chest pain or shortness of breath, CALL 911 and be transported to the hospital emergency room.  If you develope a fever above 101 F, pus (white drainage) or increased drainage or redness at the wound, or  calf pain, call your surgeon's office.   Call MD / Call 911   Complete by: As directed    If you experience chest pain or shortness of breath, CALL 911 and be transported to the hospital emergency room.  If you develope a fever above 101 F, pus (white drainage) or increased drainage or redness at the wound, or calf pain, call your surgeon's office.   Change dressing   Complete by: As directed    Maintain  surgical dressing until follow up in the clinic. If the edges start to pull up, may reinforce with tape. If the dressing is no longer working, may remove and cover with gauze and tape, but must keep the area dry and clean.  Call with any questions or concerns.   Change dressing   Complete by: As directed    Maintain surgical dressing until follow up in the clinic. If the edges start to pull up, may reinforce with tape. If the dressing is no longer working, may remove and cover with gauze and tape, but must keep the area dry and clean.  Call with any questions or concerns.   Constipation Prevention   Complete by: As directed    Drink plenty of fluids.  Prune juice may be helpful.  You may use a stool softener, such as Colace (over the counter) 100 mg twice a day.  Use MiraLax (over the counter) for constipation as needed.   Constipation Prevention   Complete by: As directed    Drink plenty of fluids.  Prune juice may be helpful.  You may use a stool softener, such as Colace (over the counter) 100 mg twice a day.  Use MiraLax (over the counter) for constipation as needed.   Diet - low sodium heart healthy   Complete by: As directed    Diet - low sodium heart healthy   Complete by: As directed    Increase activity slowly as tolerated   Complete by: As directed    Weight bearing as tolerated with assist device (walker, cane, etc) as directed, use it as long as suggested by your surgeon or therapist, typically at least 4-6 weeks.   Increase activity slowly as tolerated   Complete by: As directed     Weight bearing as tolerated with assist device (walker, cane, etc) as directed, use it as long as suggested by your surgeon or therapist, typically at least 4-6 weeks.   Post-operative opioid taper instructions:   Complete by: As directed    POST-OPERATIVE OPIOID TAPER INSTRUCTIONS: It is important to wean off of your opioid medication as soon as possible. If you do not need pain medication after your surgery it is ok to stop day one. Opioids include: Codeine, Hydrocodone(Norco, Vicodin), Oxycodone(Percocet, oxycontin) and hydromorphone amongst others.  Long term and even short term use of opiods can cause: Increased pain response Dependence Constipation Depression Respiratory depression And more.  Withdrawal symptoms can include Flu like symptoms Nausea, vomiting And more Techniques to manage these symptoms Hydrate well Eat regular healthy meals Stay active Use relaxation techniques(deep breathing, meditating, yoga) Do Not substitute Alcohol to help with tapering If you have been on opioids for less than two weeks and do not have pain than it is ok to stop all together.  Plan to wean off of opioids This plan should start within one week post op of your joint replacement. Maintain the same interval or time between taking each dose and first decrease the dose.  Cut the total daily intake of opioids by one tablet each day Next start to increase the time between doses. The last dose that should be eliminated is the evening dose.      Post-operative opioid taper instructions:   Complete by: As directed    POST-OPERATIVE OPIOID TAPER INSTRUCTIONS: It is important to wean off of your opioid medication as soon as possible. If you do not need pain medication after your surgery it is ok to stop day one. Opioids  include: Codeine, Hydrocodone(Norco, Vicodin), Oxycodone(Percocet, oxycontin) and hydromorphone amongst others.  Long term and even short term use of opiods can  cause: Increased pain response Dependence Constipation Depression Respiratory depression And more.  Withdrawal symptoms can include Flu like symptoms Nausea, vomiting And more Techniques to manage these symptoms Hydrate well Eat regular healthy meals Stay active Use relaxation techniques(deep breathing, meditating, yoga) Do Not substitute Alcohol to help with tapering If you have been on opioids for less than two weeks and do not have pain than it is ok to stop all together.  Plan to wean off of opioids This plan should start within one week post op of your joint replacement. Maintain the same interval or time between taking each dose and first decrease the dose.  Cut the total daily intake of opioids by one tablet each day Next start to increase the time between doses. The last dose that should be eliminated is the evening dose.      TED hose   Complete by: As directed    Use stockings (TED hose) for 2 weeks on both leg(s).  You may remove them at night for sleeping.   TED hose   Complete by: As directed    Use stockings (TED hose) for 2 weeks on both leg(s).  You may remove them at night for sleeping.        Follow-up Information     Durene Romans, MD. Go on 01/07/2022.   Specialty: Orthopedic Surgery Why: You are scheduled for first post op appointment on Wednesday February 22nd at 10:45am. Contact information: [address] [place] [address] [phone]         Zorita Pang.. Go on 12/26/2021.   Why: You are scheduled for physical therapy eval on Friday February 10th at 11:30am. Contact information: [address] [place] [address] [phone]                  Signed: Cassandria Anger 01/01/2022, 10:03 AM

## 2022-01-02 DIAGNOSIS — M25562 Pain in left knee: Secondary | ICD-10-CM | POA: Diagnosis not present

## 2022-01-05 DIAGNOSIS — M25562 Pain in left knee: Secondary | ICD-10-CM | POA: Diagnosis not present

## 2022-01-07 DIAGNOSIS — M25562 Pain in left knee: Secondary | ICD-10-CM | POA: Diagnosis not present

## 2022-01-09 DIAGNOSIS — M25562 Pain in left knee: Secondary | ICD-10-CM | POA: Diagnosis not present

## 2022-01-12 DIAGNOSIS — M25562 Pain in left knee: Secondary | ICD-10-CM | POA: Diagnosis not present

## 2022-01-15 DIAGNOSIS — M25562 Pain in left knee: Secondary | ICD-10-CM | POA: Diagnosis not present

## 2022-01-19 DIAGNOSIS — M25562 Pain in left knee: Secondary | ICD-10-CM | POA: Diagnosis not present

## 2022-01-22 DIAGNOSIS — M25562 Pain in left knee: Secondary | ICD-10-CM | POA: Diagnosis not present

## 2022-01-27 DIAGNOSIS — M25562 Pain in left knee: Secondary | ICD-10-CM | POA: Diagnosis not present

## 2022-02-04 DIAGNOSIS — M25562 Pain in left knee: Secondary | ICD-10-CM | POA: Diagnosis not present

## 2022-02-06 DIAGNOSIS — M25562 Pain in left knee: Secondary | ICD-10-CM | POA: Diagnosis not present

## 2022-02-10 DIAGNOSIS — M25562 Pain in left knee: Secondary | ICD-10-CM | POA: Diagnosis not present

## 2022-02-10 DIAGNOSIS — M65331 Trigger finger, right middle finger: Secondary | ICD-10-CM | POA: Diagnosis not present

## 2022-02-11 DIAGNOSIS — Z4789 Encounter for other orthopedic aftercare: Secondary | ICD-10-CM | POA: Diagnosis not present

## 2022-02-11 DIAGNOSIS — Z96652 Presence of left artificial knee joint: Secondary | ICD-10-CM | POA: Diagnosis not present

## 2022-02-11 DIAGNOSIS — Z471 Aftercare following joint replacement surgery: Secondary | ICD-10-CM | POA: Diagnosis not present

## 2022-02-18 DIAGNOSIS — M25562 Pain in left knee: Secondary | ICD-10-CM | POA: Diagnosis not present

## 2022-02-23 DIAGNOSIS — M25562 Pain in left knee: Secondary | ICD-10-CM | POA: Diagnosis not present

## 2022-02-25 DIAGNOSIS — M25562 Pain in left knee: Secondary | ICD-10-CM | POA: Diagnosis not present

## 2022-02-26 DIAGNOSIS — M65331 Trigger finger, right middle finger: Secondary | ICD-10-CM | POA: Diagnosis not present

## 2022-03-09 DIAGNOSIS — M25562 Pain in left knee: Secondary | ICD-10-CM | POA: Diagnosis not present

## 2022-03-12 DIAGNOSIS — M25562 Pain in left knee: Secondary | ICD-10-CM | POA: Diagnosis not present

## 2022-03-16 DIAGNOSIS — M25562 Pain in left knee: Secondary | ICD-10-CM | POA: Diagnosis not present

## 2022-03-19 DIAGNOSIS — M25562 Pain in left knee: Secondary | ICD-10-CM | POA: Diagnosis not present

## 2022-04-08 DIAGNOSIS — D225 Melanocytic nevi of trunk: Secondary | ICD-10-CM | POA: Diagnosis not present

## 2022-04-08 DIAGNOSIS — L821 Other seborrheic keratosis: Secondary | ICD-10-CM | POA: Diagnosis not present

## 2022-04-08 DIAGNOSIS — L719 Rosacea, unspecified: Secondary | ICD-10-CM | POA: Diagnosis not present

## 2022-04-08 DIAGNOSIS — L814 Other melanin hyperpigmentation: Secondary | ICD-10-CM | POA: Diagnosis not present

## 2022-04-08 DIAGNOSIS — L578 Other skin changes due to chronic exposure to nonionizing radiation: Secondary | ICD-10-CM | POA: Diagnosis not present

## 2022-05-12 DIAGNOSIS — G4721 Circadian rhythm sleep disorder, delayed sleep phase type: Secondary | ICD-10-CM | POA: Diagnosis not present

## 2022-05-12 DIAGNOSIS — F411 Generalized anxiety disorder: Secondary | ICD-10-CM | POA: Diagnosis not present

## 2022-05-12 DIAGNOSIS — F341 Dysthymic disorder: Secondary | ICD-10-CM | POA: Diagnosis not present

## 2022-05-12 DIAGNOSIS — G4710 Hypersomnia, unspecified: Secondary | ICD-10-CM | POA: Diagnosis not present

## 2022-05-27 DIAGNOSIS — E785 Hyperlipidemia, unspecified: Secondary | ICD-10-CM | POA: Diagnosis not present

## 2022-05-27 DIAGNOSIS — R7989 Other specified abnormal findings of blood chemistry: Secondary | ICD-10-CM | POA: Diagnosis not present

## 2022-06-10 DIAGNOSIS — Z23 Encounter for immunization: Secondary | ICD-10-CM | POA: Diagnosis not present

## 2022-06-10 DIAGNOSIS — Z0000 Encounter for general adult medical examination without abnormal findings: Secondary | ICD-10-CM | POA: Diagnosis not present

## 2022-06-10 DIAGNOSIS — Z1331 Encounter for screening for depression: Secondary | ICD-10-CM | POA: Diagnosis not present

## 2022-06-10 DIAGNOSIS — N939 Abnormal uterine and vaginal bleeding, unspecified: Secondary | ICD-10-CM | POA: Diagnosis not present

## 2022-06-10 DIAGNOSIS — F321 Major depressive disorder, single episode, moderate: Secondary | ICD-10-CM | POA: Diagnosis not present

## 2022-06-10 DIAGNOSIS — E785 Hyperlipidemia, unspecified: Secondary | ICD-10-CM | POA: Diagnosis not present

## 2022-06-10 DIAGNOSIS — N951 Menopausal and female climacteric states: Secondary | ICD-10-CM | POA: Diagnosis not present

## 2022-06-18 DIAGNOSIS — H524 Presbyopia: Secondary | ICD-10-CM | POA: Diagnosis not present

## 2022-06-18 DIAGNOSIS — H26493 Other secondary cataract, bilateral: Secondary | ICD-10-CM | POA: Diagnosis not present

## 2022-06-18 DIAGNOSIS — Z961 Presence of intraocular lens: Secondary | ICD-10-CM | POA: Diagnosis not present

## 2022-07-09 DIAGNOSIS — Z780 Asymptomatic menopausal state: Secondary | ICD-10-CM | POA: Diagnosis not present

## 2022-07-30 DIAGNOSIS — H26492 Other secondary cataract, left eye: Secondary | ICD-10-CM | POA: Diagnosis not present

## 2022-08-06 DIAGNOSIS — H26491 Other secondary cataract, right eye: Secondary | ICD-10-CM | POA: Diagnosis not present

## 2022-11-25 DIAGNOSIS — G4721 Circadian rhythm sleep disorder, delayed sleep phase type: Secondary | ICD-10-CM | POA: Diagnosis not present

## 2022-11-25 DIAGNOSIS — F411 Generalized anxiety disorder: Secondary | ICD-10-CM | POA: Diagnosis not present

## 2022-11-25 DIAGNOSIS — G4710 Hypersomnia, unspecified: Secondary | ICD-10-CM | POA: Diagnosis not present

## 2022-11-25 DIAGNOSIS — F341 Dysthymic disorder: Secondary | ICD-10-CM | POA: Diagnosis not present

## 2022-12-07 DIAGNOSIS — Z6831 Body mass index (BMI) 31.0-31.9, adult: Secondary | ICD-10-CM | POA: Diagnosis not present

## 2022-12-07 DIAGNOSIS — Z1231 Encounter for screening mammogram for malignant neoplasm of breast: Secondary | ICD-10-CM | POA: Diagnosis not present

## 2022-12-07 DIAGNOSIS — Z01419 Encounter for gynecological examination (general) (routine) without abnormal findings: Secondary | ICD-10-CM | POA: Diagnosis not present

## 2023-02-08 DIAGNOSIS — Z96652 Presence of left artificial knee joint: Secondary | ICD-10-CM | POA: Diagnosis not present

## 2023-04-21 DIAGNOSIS — L859 Epidermal thickening, unspecified: Secondary | ICD-10-CM | POA: Diagnosis not present

## 2023-04-21 DIAGNOSIS — L578 Other skin changes due to chronic exposure to nonionizing radiation: Secondary | ICD-10-CM | POA: Diagnosis not present

## 2023-04-21 DIAGNOSIS — D225 Melanocytic nevi of trunk: Secondary | ICD-10-CM | POA: Diagnosis not present

## 2023-04-21 DIAGNOSIS — D485 Neoplasm of uncertain behavior of skin: Secondary | ICD-10-CM | POA: Diagnosis not present

## 2023-04-21 DIAGNOSIS — L814 Other melanin hyperpigmentation: Secondary | ICD-10-CM | POA: Diagnosis not present

## 2023-04-21 DIAGNOSIS — C44311 Basal cell carcinoma of skin of nose: Secondary | ICD-10-CM | POA: Diagnosis not present

## 2023-04-21 DIAGNOSIS — L719 Rosacea, unspecified: Secondary | ICD-10-CM | POA: Diagnosis not present

## 2023-04-21 DIAGNOSIS — L658 Other specified nonscarring hair loss: Secondary | ICD-10-CM | POA: Diagnosis not present

## 2023-04-21 DIAGNOSIS — L821 Other seborrheic keratosis: Secondary | ICD-10-CM | POA: Diagnosis not present

## 2023-06-09 DIAGNOSIS — C44311 Basal cell carcinoma of skin of nose: Secondary | ICD-10-CM | POA: Diagnosis not present

## 2023-06-10 DIAGNOSIS — Z1211 Encounter for screening for malignant neoplasm of colon: Secondary | ICD-10-CM | POA: Diagnosis not present

## 2023-06-10 DIAGNOSIS — R1033 Periumbilical pain: Secondary | ICD-10-CM | POA: Diagnosis not present

## 2023-06-10 DIAGNOSIS — K5900 Constipation, unspecified: Secondary | ICD-10-CM | POA: Diagnosis not present

## 2023-06-10 DIAGNOSIS — K219 Gastro-esophageal reflux disease without esophagitis: Secondary | ICD-10-CM | POA: Diagnosis not present

## 2023-06-11 DIAGNOSIS — M65341 Trigger finger, right ring finger: Secondary | ICD-10-CM | POA: Diagnosis not present

## 2023-06-29 DIAGNOSIS — C44311 Basal cell carcinoma of skin of nose: Secondary | ICD-10-CM | POA: Diagnosis not present

## 2023-06-30 DIAGNOSIS — Z1211 Encounter for screening for malignant neoplasm of colon: Secondary | ICD-10-CM | POA: Diagnosis not present

## 2023-06-30 DIAGNOSIS — Z1212 Encounter for screening for malignant neoplasm of rectum: Secondary | ICD-10-CM | POA: Diagnosis not present

## 2023-07-05 LAB — COLOGUARD: COLOGUARD: NEGATIVE

## 2023-07-16 DIAGNOSIS — Z79899 Other long term (current) drug therapy: Secondary | ICD-10-CM | POA: Diagnosis not present

## 2023-07-16 DIAGNOSIS — E785 Hyperlipidemia, unspecified: Secondary | ICD-10-CM | POA: Diagnosis not present

## 2023-08-04 DIAGNOSIS — W101XXA Fall (on)(from) sidewalk curb, initial encounter: Secondary | ICD-10-CM | POA: Diagnosis not present

## 2023-08-04 DIAGNOSIS — S0011XA Contusion of right eyelid and periocular area, initial encounter: Secondary | ICD-10-CM | POA: Diagnosis not present

## 2023-08-04 DIAGNOSIS — M542 Cervicalgia: Secondary | ICD-10-CM | POA: Diagnosis not present

## 2023-08-04 DIAGNOSIS — S0181XA Laceration without foreign body of other part of head, initial encounter: Secondary | ICD-10-CM | POA: Diagnosis not present

## 2023-08-04 DIAGNOSIS — S0083XA Contusion of other part of head, initial encounter: Secondary | ICD-10-CM | POA: Diagnosis not present

## 2023-08-04 DIAGNOSIS — W19XXXA Unspecified fall, initial encounter: Secondary | ICD-10-CM | POA: Diagnosis not present

## 2023-08-06 DIAGNOSIS — N951 Menopausal and female climacteric states: Secondary | ICD-10-CM | POA: Diagnosis not present

## 2023-08-06 DIAGNOSIS — N939 Abnormal uterine and vaginal bleeding, unspecified: Secondary | ICD-10-CM | POA: Diagnosis not present

## 2023-08-06 DIAGNOSIS — Z23 Encounter for immunization: Secondary | ICD-10-CM | POA: Diagnosis not present

## 2023-08-06 DIAGNOSIS — M419 Scoliosis, unspecified: Secondary | ICD-10-CM | POA: Diagnosis not present

## 2023-08-06 DIAGNOSIS — M8589 Other specified disorders of bone density and structure, multiple sites: Secondary | ICD-10-CM | POA: Diagnosis not present

## 2023-08-06 DIAGNOSIS — Z0000 Encounter for general adult medical examination without abnormal findings: Secondary | ICD-10-CM | POA: Diagnosis not present

## 2023-08-06 DIAGNOSIS — E785 Hyperlipidemia, unspecified: Secondary | ICD-10-CM | POA: Diagnosis not present

## 2023-08-06 DIAGNOSIS — R82998 Other abnormal findings in urine: Secondary | ICD-10-CM | POA: Diagnosis not present

## 2023-08-06 DIAGNOSIS — F321 Major depressive disorder, single episode, moderate: Secondary | ICD-10-CM | POA: Diagnosis not present

## 2023-09-07 DIAGNOSIS — D485 Neoplasm of uncertain behavior of skin: Secondary | ICD-10-CM | POA: Diagnosis not present

## 2023-09-07 DIAGNOSIS — L820 Inflamed seborrheic keratosis: Secondary | ICD-10-CM | POA: Diagnosis not present

## 2023-10-20 DIAGNOSIS — F411 Generalized anxiety disorder: Secondary | ICD-10-CM | POA: Diagnosis not present

## 2023-10-20 DIAGNOSIS — F341 Dysthymic disorder: Secondary | ICD-10-CM | POA: Diagnosis not present

## 2023-10-27 DIAGNOSIS — L570 Actinic keratosis: Secondary | ICD-10-CM | POA: Diagnosis not present

## 2023-10-27 DIAGNOSIS — L820 Inflamed seborrheic keratosis: Secondary | ICD-10-CM | POA: Diagnosis not present

## 2023-11-24 DIAGNOSIS — H5213 Myopia, bilateral: Secondary | ICD-10-CM | POA: Diagnosis not present

## 2023-11-24 DIAGNOSIS — H04123 Dry eye syndrome of bilateral lacrimal glands: Secondary | ICD-10-CM | POA: Diagnosis not present

## 2023-11-24 DIAGNOSIS — Z961 Presence of intraocular lens: Secondary | ICD-10-CM | POA: Diagnosis not present

## 2024-01-17 DIAGNOSIS — Z124 Encounter for screening for malignant neoplasm of cervix: Secondary | ICD-10-CM | POA: Diagnosis not present

## 2024-01-17 DIAGNOSIS — Z01419 Encounter for gynecological examination (general) (routine) without abnormal findings: Secondary | ICD-10-CM | POA: Diagnosis not present

## 2024-01-17 DIAGNOSIS — Z1231 Encounter for screening mammogram for malignant neoplasm of breast: Secondary | ICD-10-CM | POA: Diagnosis not present

## 2024-03-14 DIAGNOSIS — D485 Neoplasm of uncertain behavior of skin: Secondary | ICD-10-CM | POA: Diagnosis not present

## 2024-03-14 DIAGNOSIS — L820 Inflamed seborrheic keratosis: Secondary | ICD-10-CM | POA: Diagnosis not present

## 2024-03-14 DIAGNOSIS — C44311 Basal cell carcinoma of skin of nose: Secondary | ICD-10-CM | POA: Diagnosis not present

## 2024-03-28 DIAGNOSIS — C44311 Basal cell carcinoma of skin of nose: Secondary | ICD-10-CM | POA: Diagnosis not present

## 2024-04-18 DIAGNOSIS — L853 Xerosis cutis: Secondary | ICD-10-CM | POA: Diagnosis not present

## 2024-04-18 DIAGNOSIS — L309 Dermatitis, unspecified: Secondary | ICD-10-CM | POA: Diagnosis not present

## 2024-04-18 DIAGNOSIS — L821 Other seborrheic keratosis: Secondary | ICD-10-CM | POA: Diagnosis not present

## 2024-04-18 DIAGNOSIS — D225 Melanocytic nevi of trunk: Secondary | ICD-10-CM | POA: Diagnosis not present

## 2024-04-18 DIAGNOSIS — L814 Other melanin hyperpigmentation: Secondary | ICD-10-CM | POA: Diagnosis not present

## 2024-04-18 DIAGNOSIS — L578 Other skin changes due to chronic exposure to nonionizing radiation: Secondary | ICD-10-CM | POA: Diagnosis not present

## 2024-04-18 DIAGNOSIS — B078 Other viral warts: Secondary | ICD-10-CM | POA: Diagnosis not present

## 2024-07-14 DIAGNOSIS — F411 Generalized anxiety disorder: Secondary | ICD-10-CM | POA: Diagnosis not present

## 2024-07-14 DIAGNOSIS — Z5181 Encounter for therapeutic drug level monitoring: Secondary | ICD-10-CM | POA: Diagnosis not present

## 2024-07-14 DIAGNOSIS — F341 Dysthymic disorder: Secondary | ICD-10-CM | POA: Diagnosis not present

## 2024-07-14 DIAGNOSIS — Z79899 Other long term (current) drug therapy: Secondary | ICD-10-CM | POA: Diagnosis not present
